# Patient Record
Sex: Male | Born: 1990
Health system: Southern US, Community
[De-identification: ages and names within clinical notes are randomized; demographics above are authoritative.]

## PROBLEM LIST (undated history)

## (undated) DIAGNOSIS — I82409 Acute embolism and thrombosis of unspecified deep veins of unspecified lower extremity: Secondary | ICD-10-CM

## (undated) DIAGNOSIS — K219 Gastro-esophageal reflux disease without esophagitis: Secondary | ICD-10-CM

## (undated) DIAGNOSIS — M75111 Incomplete rotator cuff tear or rupture of right shoulder, not specified as traumatic: Secondary | ICD-10-CM

## (undated) DIAGNOSIS — M7541 Impingement syndrome of right shoulder: Secondary | ICD-10-CM

## (undated) HISTORY — DX: Acute embolism and thrombosis of unspecified deep veins of unspecified lower extremity: I82.409

## (undated) HISTORY — PX: NO PAST SURGERIES: SHX2092

---
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
## 2010-08-01 ENCOUNTER — Inpatient Hospital Stay (INDEPENDENT_AMBULATORY_CARE_PROVIDER_SITE_OTHER)
Admission: RE | Admit: 2010-08-01 | Discharge: 2010-08-01 | Disposition: A | Discharge status: Home / Self Care | Payer: Self-pay | Source: Ambulatory Visit | Attending: Emergency Medicine | Admitting: Emergency Medicine

## 2010-08-01 DIAGNOSIS — R369 Urethral discharge, unspecified: Secondary | ICD-10-CM

## 2014-04-30 ENCOUNTER — Emergency Department (INDEPENDENT_AMBULATORY_CARE_PROVIDER_SITE_OTHER)
Admission: EM | Admit: 2014-04-30 | Discharge: 2014-04-30 | Disposition: A | Discharge status: Home / Self Care | Payer: Self-pay | Source: Home / Self Care | Attending: Family Medicine | Admitting: Family Medicine

## 2014-04-30 ENCOUNTER — Encounter (HOSPITAL_COMMUNITY): Payer: Self-pay | Admitting: Emergency Medicine

## 2014-04-30 DIAGNOSIS — R112 Nausea with vomiting, unspecified: Secondary | ICD-10-CM

## 2014-04-30 MED ORDER — OMEPRAZOLE 40 MG PO CPDR
40.0000 mg | DELAYED_RELEASE_CAPSULE | Freq: Every day | ORAL | Status: DC
Start: 2014-04-30 — End: 2016-08-19

## 2014-04-30 MED ORDER — FLUTICASONE PROPIONATE 50 MCG/ACT NA SUSP: 2.0000 | Freq: Every day | NASAL | Status: DC

## 2014-04-30 NOTE — ED Provider Notes (Signed)
Andrew Reilly is a 23 y.o. male who presents to Urgent Care today for Vomiting. Patient has occasional intermittent vomiting over the past 3 weeks. His vomiting is preceded by a sour taste in his mouth with mucus. He occasionally gets some indigestion after eating a heavy meal. He notes some nasal drainage. No fevers or chills. No abdominal pain. Patient feels well. He has not tried any medications yet.   History reviewed. No pertinent past medical history. History reviewed. No pertinent past surgical history. History  Substance Use Topics  . Smoking status: Never Smoker   . Smokeless tobacco: Not on file  . Alcohol Use: No   ROS as above Medications: No current facility-administered medications for this encounter.   Current Outpatient Prescriptions  Medication Sig Dispense Refill  . fluticasone (FLONASE) 50 MCG/ACT nasal spray Place 2 sprays into both nostrils daily. 16 g 12  . omeprazole (PRILOSEC) 40 MG capsule Take 1 capsule (40 mg total) by mouth daily. 30 capsule 12   No Known Allergies   Exam:  BP 126/80 mmHg  Pulse 80  Temp(Src) 98.2 F (36.8 C) (Oral)  Resp 16  SpO2 98% Gen: Well NAD nontoxic appearing HEENT: EOMI,  MMM normal posterior pharynx Lungs: Normal work of breathing. CTABL Heart: RRR no MRG Abd: NABS, Soft. Nondistended, Nontender no rebound or guarding. No masses palpated. Exts: Brisk capillary refill, warm and well perfused.   No results found for this or any previous visit (from the past 24 hour(s)). No results found.  Assessment and Plan: 23 y.o. male with vomiting. Likely related to acid reflux or postnasal drip. Treatment with omeprazole and Flonase. Follow-up with PCP as needed.  Discussed warning signs or symptoms. Please see discharge instructions. Patient expresses understanding.     Rodolph BongEvan S Deidrick Rainey, MD 04/30/14 1140

## 2014-04-30 NOTE — Discharge Instructions (Signed)
Thank you for coming in today. Call or go to the emergency room if you get worse, have trouble breathing, have chest pains, or palpitations.   I think you have either postnasal drip or acid reflux. Use Flonase nasal spray as directed and take omeprazole. Come back as needed. If your belly pain worsens, or you have high fever, bad vomiting, blood in your stool or black tarry stool go to the Emergency Room.   Gastroesophageal Reflux Disease, Adult Gastroesophageal reflux disease (GERD) happens when acid from your stomach flows up into the esophagus. When acid comes in contact with the esophagus, the acid causes soreness (inflammation) in the esophagus. Over time, GERD may create small holes (ulcers) in the lining of the esophagus. CAUSES   Increased body weight. This puts pressure on the stomach, making acid rise from the stomach into the esophagus.  Smoking. This increases acid production in the stomach.  Drinking alcohol. This causes decreased pressure in the lower esophageal sphincter (valve or ring of muscle between the esophagus and stomach), allowing acid from the stomach into the esophagus.  Late evening meals and a full stomach. This increases pressure and acid production in the stomach.  A malformed lower esophageal sphincter. Sometimes, no cause is found. SYMPTOMS   Burning pain in the lower part of the mid-chest behind the breastbone and in the mid-stomach area. This may occur twice a week or more often.  Trouble swallowing.  Sore throat.  Dry cough.  Asthma-like symptoms including chest tightness, shortness of breath, or wheezing. DIAGNOSIS  Your caregiver may be able to diagnose GERD based on your symptoms. In some cases, X-rays and other tests may be done to check for complications or to check the condition of your stomach and esophagus. TREATMENT  Your caregiver may recommend over-the-counter or prescription medicines to help decrease acid production. Ask your caregiver  before starting or adding any new medicines.  HOME CARE INSTRUCTIONS   Change the factors that you can control. Ask your caregiver for guidance concerning weight loss, quitting smoking, and alcohol consumption.  Avoid foods and drinks that make your symptoms worse, such as:  Caffeine or alcoholic drinks.  Chocolate.  Peppermint or mint flavorings.  Garlic and onions.  Spicy foods.  Citrus fruits, such as oranges, lemons, or limes.  Tomato-based foods such as sauce, chili, salsa, and pizza.  Fried and fatty foods.  Avoid lying down for the 3 hours prior to your bedtime or prior to taking a nap.  Eat small, frequent meals instead of large meals.  Wear loose-fitting clothing. Do not wear anything tight around your waist that causes pressure on your stomach.  Raise the head of your bed 6 to 8 inches with wood blocks to help you sleep. Extra pillows will not help.  Only take over-the-counter or prescription medicines for pain, discomfort, or fever as directed by your caregiver.  Do not take aspirin, ibuprofen, or other nonsteroidal anti-inflammatory drugs (NSAIDs). SEEK IMMEDIATE MEDICAL CARE IF:   You have pain in your arms, neck, jaw, teeth, or back.  Your pain increases or changes in intensity or duration.  You develop nausea, vomiting, or sweating (diaphoresis).  You develop shortness of breath, or you faint.  Your vomit is green, yellow, black, or looks like coffee grounds or blood.  Your stool is red, bloody, or black. These symptoms could be signs of other problems, such as heart disease, gastric bleeding, or esophageal bleeding. MAKE SURE YOU:   Understand these instructions.  Will watch  your condition.  Will get help right away if you are not doing well or get worse. Document Released: 03/09/2005 Document Revised: 08/22/2011 Document Reviewed: 12/17/2010 [facility] Patient Information 2015 [employer] [place]. This information is not intended to replace advice  given to you by your health care provider. Make sure you discuss any questions you have with your health care provider.

## 2014-04-30 NOTE — ED Notes (Signed)
Reports nauseas and vomiting onset 3 weeks Believes it maybe due to PND; notices vomiting after coughing Denies abd pain, diarrhea, fevers Alert, no signs of acute distress.

## 2016-08-19 ENCOUNTER — Encounter (HOSPITAL_BASED_OUTPATIENT_CLINIC_OR_DEPARTMENT_OTHER): Payer: Self-pay | Admitting: *Deleted

## 2016-08-22 ENCOUNTER — Encounter (HOSPITAL_BASED_OUTPATIENT_CLINIC_OR_DEPARTMENT_OTHER): Payer: Self-pay | Admitting: *Deleted

## 2016-08-22 NOTE — Progress Notes (Signed)
NPO AFTER MN WITH EXCEPTION WATER, GATORADE, SPRITE UP UNTIL 0630.  ARRIVE AT 1030.  NEEDS HG.

## 2016-08-25 ENCOUNTER — Encounter (HOSPITAL_BASED_OUTPATIENT_CLINIC_OR_DEPARTMENT_OTHER): Payer: Self-pay | Admitting: *Deleted

## 2016-08-25 ENCOUNTER — Ambulatory Visit (HOSPITAL_BASED_OUTPATIENT_CLINIC_OR_DEPARTMENT_OTHER): Payer: Managed Care, Other (non HMO) | Admitting: Anesthesiology

## 2016-08-25 ENCOUNTER — Ambulatory Visit (HOSPITAL_BASED_OUTPATIENT_CLINIC_OR_DEPARTMENT_OTHER)
Admission: RE | Admit: 2016-08-25 | Discharge: 2016-08-25 | Disposition: A | Discharge status: Home / Self Care | Payer: Managed Care, Other (non HMO) | Source: Ambulatory Visit | Attending: Orthopedic Surgery | Admitting: Orthopedic Surgery

## 2016-08-25 ENCOUNTER — Encounter (HOSPITAL_BASED_OUTPATIENT_CLINIC_OR_DEPARTMENT_OTHER)
Admission: RE | Disposition: A | Discharge status: Home / Self Care | Payer: Self-pay | Source: Ambulatory Visit | Attending: Orthopedic Surgery

## 2016-08-25 DIAGNOSIS — F17210 Nicotine dependence, cigarettes, uncomplicated: Secondary | ICD-10-CM | POA: Insufficient documentation

## 2016-08-25 DIAGNOSIS — K219 Gastro-esophageal reflux disease without esophagitis: Secondary | ICD-10-CM | POA: Diagnosis not present

## 2016-08-25 DIAGNOSIS — M24111 Other articular cartilage disorders, right shoulder: Secondary | ICD-10-CM | POA: Diagnosis present

## 2016-08-25 DIAGNOSIS — M7541 Impingement syndrome of right shoulder: Secondary | ICD-10-CM | POA: Diagnosis present

## 2016-08-25 HISTORY — PX: SHOULDER ARTHROSCOPY WITH ROTATOR CUFF REPAIR AND SUBACROMIAL DECOMPRESSION: SHX5686

## 2016-08-25 HISTORY — DX: Gastro-esophageal reflux disease without esophagitis: K21.9

## 2016-08-25 HISTORY — DX: Impingement syndrome of right shoulder: M75.41

## 2016-08-25 HISTORY — DX: Incomplete rotator cuff tear or rupture of right shoulder, not specified as traumatic: M75.111

## 2016-08-25 LAB — POCT HEMOGLOBIN-HEMACUE: Hemoglobin: 13.9 g/dL (ref 13.0–17.0)

## 2016-08-25 SURGERY — SHOULDER ARTHROSCOPY WITH ROTATOR CUFF REPAIR AND SUBACROMIAL DECOMPRESSION
Anesthesia: General | Site: Shoulder | Laterality: Right

## 2016-08-25 MED ORDER — PHENYLEPHRINE 40 MCG/ML (10ML) SYRINGE FOR IV PUSH (FOR BLOOD PRESSURE SUPPORT)
PREFILLED_SYRINGE | INTRAVENOUS | Status: DC | PRN
  Administered 2016-08-25 (×5): 80 ug via INTRAVENOUS

## 2016-08-25 MED ORDER — BUPIVACAINE HCL (PF) 0.25 % IJ SOLN
INTRAMUSCULAR | Status: DC | PRN
  Administered 2016-08-25: 20 mL

## 2016-08-25 MED ORDER — FENTANYL CITRATE (PF) 100 MCG/2ML IJ SOLN
INTRAMUSCULAR | Status: AC
  Filled 2016-08-25: qty 2

## 2016-08-25 MED ORDER — PHENYLEPHRINE HCL 10 MG/ML IJ SOLN
INTRAMUSCULAR | Status: AC
  Filled 2016-08-25: qty 1

## 2016-08-25 MED ORDER — MIDAZOLAM HCL 2 MG/2ML IJ SOLN
INTRAMUSCULAR | Status: AC
Start: 2016-08-25 — End: 2016-08-25
  Filled 2016-08-25: qty 2

## 2016-08-25 MED ORDER — SUCCINYLCHOLINE CHLORIDE 20 MG/ML IJ SOLN
INTRAMUSCULAR | Status: DC | PRN
  Administered 2016-08-25: 120 mg via INTRAVENOUS

## 2016-08-25 MED ORDER — BUPIVACAINE-EPINEPHRINE (PF) 0.5% -1:200000 IJ SOLN
INTRAMUSCULAR | Status: DC | PRN
  Administered 2016-08-25: 25 mL

## 2016-08-25 MED ORDER — ONDANSETRON HCL 4 MG/2ML IJ SOLN
INTRAMUSCULAR | Status: AC
  Filled 2016-08-25: qty 2

## 2016-08-25 MED ORDER — PHENYLEPHRINE 40 MCG/ML (10ML) SYRINGE FOR IV PUSH (FOR BLOOD PRESSURE SUPPORT)
PREFILLED_SYRINGE | INTRAVENOUS | Status: AC
  Filled 2016-08-25: qty 10

## 2016-08-25 MED ORDER — LACTATED RINGERS IV SOLN
INTRAVENOUS | Status: DC
  Administered 2016-08-25 (×2): via INTRAVENOUS
  Filled 2016-08-25: qty 1000

## 2016-08-25 MED ORDER — CEFAZOLIN SODIUM-DEXTROSE 2-4 GM/100ML-% IV SOLN
INTRAVENOUS | Status: AC
  Filled 2016-08-25: qty 100

## 2016-08-25 MED ORDER — PROPOFOL 10 MG/ML IV BOLUS
INTRAVENOUS | Status: DC | PRN
  Administered 2016-08-25: 50 mg via INTRAVENOUS
  Administered 2016-08-25: 200 mg via INTRAVENOUS
  Administered 2016-08-25: 100 mg via INTRAVENOUS

## 2016-08-25 MED ORDER — MIDAZOLAM HCL 2 MG/2ML IJ SOLN
2.0000 mg | Freq: Once | INTRAMUSCULAR | Status: AC
  Administered 2016-08-25: 2 mg via INTRAVENOUS
  Filled 2016-08-25: qty 2

## 2016-08-25 MED ORDER — LIDOCAINE 2% (20 MG/ML) 5 ML SYRINGE
INTRAMUSCULAR | Status: DC | PRN
  Administered 2016-08-25: 100 mg via INTRAVENOUS

## 2016-08-25 MED ORDER — CEFAZOLIN SODIUM-DEXTROSE 2-4 GM/100ML-% IV SOLN
2.0000 g | INTRAVENOUS | Status: AC
  Administered 2016-08-25: 2 g via INTRAVENOUS
  Filled 2016-08-25: qty 100

## 2016-08-25 MED ORDER — FENTANYL CITRATE (PF) 100 MCG/2ML IJ SOLN
100.0000 ug | Freq: Once | INTRAMUSCULAR | Status: AC
  Administered 2016-08-25: 100 ug via INTRAVENOUS
  Filled 2016-08-25: qty 2

## 2016-08-25 MED ORDER — DEXAMETHASONE SODIUM PHOSPHATE 4 MG/ML IJ SOLN
INTRAMUSCULAR | Status: DC | PRN
  Administered 2016-08-25: 10 mg via INTRAVENOUS

## 2016-08-25 MED ORDER — KETOROLAC TROMETHAMINE 30 MG/ML IJ SOLN
INTRAMUSCULAR | Status: DC | PRN
  Administered 2016-08-25: 30 mg via INTRAVENOUS

## 2016-08-25 MED ORDER — CHLORHEXIDINE GLUCONATE 4 % EX LIQD
60.0000 mL | Freq: Once | CUTANEOUS | Status: DC
  Filled 2016-08-25: qty 118

## 2016-08-25 MED ORDER — FENTANYL CITRATE (PF) 100 MCG/2ML IJ SOLN
INTRAMUSCULAR | Status: DC | PRN
  Administered 2016-08-25 (×2): 50 ug via INTRAVENOUS

## 2016-08-25 MED ORDER — ONDANSETRON 4 MG PO TBDP: 4.0000 mg | ORAL_TABLET | Freq: Three times a day (TID) | ORAL | 0 refills | Status: DC | PRN

## 2016-08-25 MED ORDER — DEXAMETHASONE SODIUM PHOSPHATE 10 MG/ML IJ SOLN
INTRAMUSCULAR | Status: AC
  Filled 2016-08-25: qty 1

## 2016-08-25 MED ORDER — KETOROLAC TROMETHAMINE 30 MG/ML IJ SOLN
INTRAMUSCULAR | Status: AC
  Filled 2016-08-25: qty 1

## 2016-08-25 MED ORDER — PROPOFOL 10 MG/ML IV BOLUS
INTRAVENOUS | Status: AC
  Filled 2016-08-25: qty 20

## 2016-08-25 MED ORDER — PHENYLEPHRINE HCL 10 MG/ML IJ SOLN
INTRAMUSCULAR | Status: DC | PRN
  Administered 2016-08-25: 100 ug/min via INTRAVENOUS

## 2016-08-25 MED ORDER — PROMETHAZINE HCL 25 MG/ML IJ SOLN
6.2500 mg | INTRAMUSCULAR | Status: DC | PRN
  Filled 2016-08-25: qty 1

## 2016-08-25 MED ORDER — HYDROCODONE-ACETAMINOPHEN 7.5-325 MG PO TABS: 1.0000 | ORAL_TABLET | Freq: Four times a day (QID) | ORAL | 0 refills | Status: DC | PRN

## 2016-08-25 MED ORDER — HYDROMORPHONE HCL 1 MG/ML IJ SOLN
0.2500 mg | INTRAMUSCULAR | Status: DC | PRN
  Filled 2016-08-25: qty 0.5

## 2016-08-25 MED ORDER — LIDOCAINE 2% (20 MG/ML) 5 ML SYRINGE
INTRAMUSCULAR | Status: AC
  Filled 2016-08-25: qty 5

## 2016-08-25 MED ORDER — SODIUM CHLORIDE 0.9 % IR SOLN
Status: DC | PRN
  Administered 2016-08-25: 6000 mL

## 2016-08-25 MED ORDER — SUCCINYLCHOLINE CHLORIDE 200 MG/10ML IV SOSY
PREFILLED_SYRINGE | INTRAVENOUS | Status: AC
  Filled 2016-08-25: qty 10

## 2016-08-25 MED ORDER — ONDANSETRON HCL 4 MG/2ML IJ SOLN
INTRAMUSCULAR | Status: DC | PRN
  Administered 2016-08-25: 4 mg via INTRAVENOUS

## 2016-08-25 MED ORDER — KETOROLAC TROMETHAMINE 30 MG/ML IJ SOLN
30.0000 mg | Freq: Once | INTRAMUSCULAR | Status: DC | PRN
  Filled 2016-08-25: qty 1

## 2016-08-25 MED FILL — ONDANSETRON ODT 4 MG TABLET: 4 | 6 days supply | Qty: 20 | Fill #0

## 2016-08-25 MED FILL — HYDROCODON-APAP 7.5-325: 7.5-325 | 5 days supply | Qty: 45 | Fill #0

## 2016-08-25 SURGICAL SUPPLY — 89 items
BLADE CUDA GRT WHITE 3.5 (BLADE) ×1 IMPLANT
BLADE CUTTER GATOR 3.5 (BLADE) IMPLANT
BLADE GREAT WHITE 4.2 (BLADE) ×1 IMPLANT
BLADE GREAT WHITE 4.2MM (BLADE)
BLADE SURG 11 STRL SS (BLADE) ×3 IMPLANT
BLADE SURG 15 STRL LF DISP TIS (BLADE) IMPLANT
BLADE SURG 15 STRL SS (BLADE)
BNDG COHESIVE 4X5 TAN NS LF (GAUZE/BANDAGES/DRESSINGS) ×3 IMPLANT
BUR 3.5 LG SPHERICAL (BURR) IMPLANT
BUR OVAL 4.0 (BURR) IMPLANT
BUR OVAL 6.0 (BURR) ×1 IMPLANT
BUR VERTEX HOODED 4.5 (BURR) IMPLANT
BURR 3.5 LG SPHERICAL (BURR)
BURR 3.5MM LG SPHERICAL (BURR)
CANNULA 5.75X7 CRYSTAL CLEAR (CANNULA) IMPLANT
CANNULA 5.75X71 LONG (CANNULA) IMPLANT
CANNULA TWIST IN 8.25X7CM (CANNULA) IMPLANT
COVER BACK TABLE 60X90IN (DRAPES) ×3 IMPLANT
COVER MAYO STAND STRL (DRAPES) ×3 IMPLANT
DRAPE LG THREE QUARTER DISP (DRAPES) ×5 IMPLANT
DRAPE ORTHO SPLIT 77X108 STRL (DRAPES) ×6
DRAPE POUCH INSTRU U-SHP 10X18 (DRAPES) ×3 IMPLANT
DRAPE STERI 35X30 U-POUCH (DRAPES) ×3 IMPLANT
DRAPE SURG 17X23 STRL (DRAPES) ×3 IMPLANT
DRAPE SURG ORHT 6 SPLT 77X108 (DRAPES) ×2 IMPLANT
DRAPE U-SHAPE 47X51 STRL (DRAPES) ×3 IMPLANT
DRSG PAD ABDOMINAL 8X10 ST (GAUZE/BANDAGES/DRESSINGS) ×5 IMPLANT
DURAPREP 26ML APPLICATOR (WOUND CARE) ×3 IMPLANT
ELECT MENISCUS 165MM 90D (ELECTRODE) IMPLANT
ELECT REM PT RETURN 9FT ADLT (ELECTROSURGICAL) ×3
ELECTRODE REM PT RTRN 9FT ADLT (ELECTROSURGICAL) ×1 IMPLANT
FIBERSTICK 2 (SUTURE) IMPLANT
GAUZE XEROFORM 1X8 LF (GAUZE/BANDAGES/DRESSINGS) ×3 IMPLANT
GLOVE BIO SURGEON STRL SZ 6.5 (GLOVE) ×1 IMPLANT
GLOVE BIO SURGEON STRL SZ7.5 (GLOVE) ×3 IMPLANT
GLOVE BIO SURGEONS STRL SZ 6.5 (GLOVE) ×1
GLOVE BIOGEL PI IND STRL 8 (GLOVE) ×1 IMPLANT
GLOVE BIOGEL PI INDICATOR 8 (GLOVE) ×2
GLOVE INDICATOR 6.5 STRL GRN (GLOVE) ×2 IMPLANT
GOWN STRL REUS W/ TWL LRG LVL3 (GOWN DISPOSABLE) ×1 IMPLANT
GOWN STRL REUS W/TWL LRG LVL3 (GOWN DISPOSABLE) ×5 IMPLANT
GOWN STRL REUS W/TWL XL LVL3 (GOWN DISPOSABLE) ×4 IMPLANT
KIT RM TURNOVER CYSTO AR (KITS) ×3 IMPLANT
LASSO SUT 90 DEGREE (SUTURE) IMPLANT
LOOP 2 FIBERLINK CLOSED (SUTURE) IMPLANT
MANIFOLD NEPTUNE II (INSTRUMENTS) ×3 IMPLANT
NDL 1/2 CIR CATGUT .05X1.09 (NEEDLE) IMPLANT
NDL SCORPION MULTI FIRE (NEEDLE) IMPLANT
NDL SPNL 18GX3.5 QUINCKE PK (NEEDLE) ×1 IMPLANT
NEEDLE 1/2 CIR CATGUT .05X1.09 (NEEDLE) IMPLANT
NEEDLE HYPO 22GX1.5 SAFETY (NEEDLE) IMPLANT
NEEDLE SCORPION MULTI FIRE (NEEDLE) IMPLANT
NEEDLE SPNL 18GX3.5 QUINCKE PK (NEEDLE) ×3 IMPLANT
NS IRRIG 500ML POUR BTL (IV SOLUTION) IMPLANT
PACK BASIN DAY SURGERY FS (CUSTOM PROCEDURE TRAY) ×3 IMPLANT
PAD ARMBOARD 7.5X6 YLW CONV (MISCELLANEOUS) IMPLANT
PENCIL BUTTON HOLSTER BLD 10FT (ELECTRODE) IMPLANT
PROBE BIPOLAR ATHRO 135MM 90D (MISCELLANEOUS) ×5 IMPLANT
SET ARTHROSCOPY TUBING (MISCELLANEOUS) ×3
SET ARTHROSCOPY TUBING PVC (MISCELLANEOUS) ×1 IMPLANT
SLEEVE ARM SUSPENSION SYSTEM (MISCELLANEOUS) ×3 IMPLANT
SLING ULTRA II AB L (ORTHOPEDIC SUPPLIES) IMPLANT
SLING ULTRA II AB S (ORTHOPEDIC SUPPLIES) IMPLANT
SPONGE GAUZE 4X4 12PLY (GAUZE/BANDAGES/DRESSINGS) ×3 IMPLANT
SPONGE GAUZE 4X4 12PLY STER LF (GAUZE/BANDAGES/DRESSINGS) ×2 IMPLANT
SPONGE LAP 4X18 X RAY DECT (DISPOSABLE) IMPLANT
SUCTION FRAZIER HANDLE 10FR (MISCELLANEOUS)
SUCTION TUBE FRAZIER 10FR DISP (MISCELLANEOUS) IMPLANT
SUT 2 FIBERLOOP 20 STRT BLUE (SUTURE)
SUT ETHILON 3 0 PS 1 (SUTURE) IMPLANT
SUT FIBERWIRE #2 38 T-5 BLUE (SUTURE)
SUT LASSO 45 DEGREE LEFT (SUTURE) IMPLANT
SUT LASSO 45D RIGHT (SUTURE) IMPLANT
SUT MNCRL AB 3-0 PS2 27 (SUTURE) ×3 IMPLANT
SUT PDS AB 0 CT1 36 (SUTURE) IMPLANT
SUT TIGER TAPE 7 IN WHITE (SUTURE) IMPLANT
SUT VIC AB 0 CT1 36 (SUTURE) IMPLANT
SUT VIC AB 2-0 CT1 27 (SUTURE)
SUT VIC AB 2-0 CT1 TAPERPNT 27 (SUTURE) IMPLANT
SUTURE 2 FIBERLOOP 20 STRT BLU (SUTURE) IMPLANT
SUTURE FIBERWR #2 38 T-5 BLUE (SUTURE) IMPLANT
SYR 20CC LL (SYRINGE) IMPLANT
SYR CONTROL 10ML LL (SYRINGE) IMPLANT
TAPE CLOTH SURG 6X10 WHT LF (GAUZE/BANDAGES/DRESSINGS) ×3 IMPLANT
TOWEL OR 17X24 6PK STRL BLUE (TOWEL DISPOSABLE) ×3 IMPLANT
TUBE CONNECTING 12'X1/4 (SUCTIONS) ×2
TUBE CONNECTING 12X1/4 (SUCTIONS) ×4 IMPLANT
WATER STERILE IRR 500ML POUR (IV SOLUTION) ×3 IMPLANT
YANKAUER SUCT BULB TIP NO VENT (SUCTIONS) IMPLANT

## 2016-08-25 NOTE — Brief Op Note (Signed)
08/25/2016  1:50 PM  PATIENT:  Andrew Reilly  26 y.o. male  PRE-OPERATIVE DIAGNOSIS:  RIGHT SHOULDER IMPINGEMENT SYNDROME  POST-OPERATIVE DIAGNOSIS:  RIGHT SHOULDER IMPINGEMENT SYNDROME  PROCEDURE:  Procedure(s): Right shoulder arthroscopy with extensive debridement and subacromial decompression (Right)  SURGEON:  Surgeon(s) and Role:    * Yolonda KidaJason Patrick Soren Lazarz, MD - Primary  PHYSICIAN ASSISTANT:   ASSISTANTS: none   ANESTHESIA:   regional and general  EBL:  Total I/O In: 1000 [I.V.:1000] Out: 10 [Blood:10]  BLOOD ADMINISTERED:none  DRAINS: none   LOCAL MEDICATIONS USED:  NONE  SPECIMEN:  No Specimen  DISPOSITION OF SPECIMEN:  N/A  COUNTS:  YES  TOURNIQUET:  * No tourniquets in log *  DICTATION: .Note written in EPIC  PLAN OF CARE: Discharge to home after PACU  PATIENT DISPOSITION:  PACU - hemodynamically stable.   Delay start of Pharmacological VTE agent (>24hrs) due to surgical blood loss or risk of bleeding: not applicable

## 2016-08-25 NOTE — Discharge Instructions (Signed)
Regional Anesthesia Blocks  1. Numbness or the inability to move the "blocked" extremity may last from 3-48 hours after placement. The length of time depends on the medication injected and your individual response to the medication. If the numbness is not going away after 48 hours, call your surgeon.  2. The extremity that is blocked will need to be protected until the numbness is gone and the  Strength has returned. Because you cannot feel it, you will need to take extra care to avoid injury. Because it may be weak, you may have difficulty moving it or using it. You may not know what position it is in without looking at it while the block is in effect.  3. For blocks in the legs and feet, returning to weight bearing and walking needs to be done carefully. You will need to wait until the numbness is entirely gone and the strength has returned. You should be able to move your leg and foot normally before you try and bear weight or walk. You will need someone to be with you when you first try to ensure you do not fall and possibly risk injury.  4. Bruising and tenderness at the needle site are common side effects and will resolve in a few days.  Call your surgeon if you experience:   1.  Fever over 101.0. 2.  Inability to urinate. 3.  Nausea and/or vomiting. 4.  Extreme swelling or bruising at the surgical site. 5.  Continued bleeding from the incision. 6.  Increased pain, redness or drainage from the incision. 7.  Problems related to your pain medication. 8.  Any problems and/or concerns 9.  Any change in color, movement or sensation  5. Persistent numbness or new problems with movement should be communicated to the surgeon or the [facility] [place] [phone])/ [facility] [place] [phone]).   Post Anesthesia Home Care Instructions  Activity: Get plenty of rest for the remainder of the day. A responsible adult should stay with you for 24 hours following the procedure.    For the next 24 hours, DO NOT: -Drive a car -Advertising copywriterperate machinery -Drink alcoholic beverages -Take any medication unless instructed by your physician -Make any legal decisions or sign important papers.  Meals: Start with liquid foods such as gelatin or soup. Progress to regular foods as tolerated. Avoid greasy, spicy, heavy foods. If nausea and/or vomiting occur, drink only clear liquids until the nausea and/or vomiting subsides. Call your physician if vomiting continues.  Special Instructions/Symptoms: Your throat may feel dry or sore from the anesthesia or the breathing tube placed in your throat during surgery. If this causes discomfort, gargle with warm salt water. The discomfort should disappear within 24 hours.  If you had a scopolamine patch placed behind your ear for the management of post- operative nausea and/or vomiting:  1. The medication in the patch is effective for 72 hours, after which it should be removed.  Wrap patch in a tissue and discard in the trash. Wash hands thoroughly with soap and water. 2. You may remove the patch earlier than 72 hours if you experience unpleasant side effects which may include dry mouth, dizziness or visual disturbances. 3. Avoid touching the patch. Wash your hands with soap and water after contact with the patch.

## 2016-08-25 NOTE — Anesthesia Procedure Notes (Signed)
Anesthesia Regional Block: Interscalene brachial plexus block   Pre-Anesthetic Checklist: ,, timeout performed, Correct Patient, Correct Site, Correct Laterality, Correct Procedure, Correct Position, site marked, Risks and benefits discussed,  Surgical consent,  Pre-op evaluation,  At surgeon's request and post-op pain management  Laterality: Right  Prep: chloraprep       Needles:  Injection technique: Single-shot  Needle Type: Echogenic Needle     Needle Length: 9cm  Needle Gauge: 21     Additional Needles:   Procedures: ultrasound guided,,,,,,,,  Narrative:  Start time: 08/25/2016 11:24 AM End time: 08/25/2016 11:31 AM Injection made incrementally with aspirations every 5 mL.  Performed by: Personally  Anesthesiologist: Marcene DuosFITZGERALD, Serena Petterson

## 2016-08-25 NOTE — Transfer of Care (Signed)
Immediate Anesthesia Transfer of Care Note  Patient: Andrew Reilly  Procedure(s) Performed: Procedure(s): Right shoulder arthroscopy with extensive debridement and subacromial decompression (Right)  Patient Location: PACU  Anesthesia Type:General  Level of Consciousness: awake and oriented  Airway & Oxygen Therapy: Patient Spontanous Breathing and Patient connected to face mask oxygen  Post-op Assessment: Report given to RN  Post vital signs: Reviewed and stable  Last Vitals:  Vitals:   08/25/16 0948 08/25/16 1140  BP: 130/80 125/82  Pulse: (!) 104   Resp: 18   Temp: 37 C     Last Pain:  Vitals:   08/25/16 0948  TempSrc: Oral      Patients Stated Pain Goal: 8 (08/25/16 1001)  Complications: No apparent anesthesia complications

## 2016-08-25 NOTE — Anesthesia Preprocedure Evaluation (Addendum)
Anesthesia Evaluation  Patient identified by MRN, date of birth, ID band Patient awake    Reviewed: Allergy & Precautions, NPO status , Patient's Chart, lab work & pertinent test results  Airway Mallampati: II  TM Distance: >3 FB Neck ROM: Full    Dental  (+) Dental Advisory Given   Pulmonary Current Smoker,    breath sounds clear to auscultation       Cardiovascular negative cardio ROS   Rhythm:Regular Rate:Normal     Neuro/Psych negative neurological ROS     GI/Hepatic Neg liver ROS, GERD  ,  Endo/Other  negative endocrine ROS  Renal/GU negative Renal ROS     Musculoskeletal   Abdominal   Peds  Hematology negative hematology ROS (+)   Anesthesia Other Findings   Reproductive/Obstetrics                            Anesthesia Physical Anesthesia Plan  ASA: I  Anesthesia Plan: General   Post-op Pain Management:  Regional for Post-op pain   Induction: Intravenous  Airway Management Planned: Oral ETT  Additional Equipment:   Intra-op Plan:   Post-operative Plan: Extubation in OR  Informed Consent: I have reviewed the patients History and Physical, chart, labs and discussed the procedure including the risks, benefits and alternatives for the proposed anesthesia with the patient or authorized representative who has indicated his/her understanding and acceptance.   Dental advisory given  Plan Discussed with:   Anesthesia Plan Comments:         Anesthesia Quick Evaluation

## 2016-08-25 NOTE — H&P (Signed)
   ORTHOPAEDIC CONSULTATION  REQUESTING PHYSICIAN: Yolonda KidaJason Patrick Rogers, MD  PCP:  No primary care provider on file.  Chief Complaint: right shoulder pain  HPI: Andrew Reilly is a 26 y.o. male who complains of recalcitrant right shoulder pain for the last few months following history of shoulder "dislocations," and he presents today for surgery.  No new complaints at this time.  Past Medical History:  Diagnosis Date  . GERD (gastroesophageal reflux disease)   . Impingement syndrome of right shoulder   . Partial tear of right rotator cuff    Past Surgical History:  Procedure Laterality Date  . NO PAST SURGERIES     Social History   Social History  . Marital status: Single    Spouse name: N/A  . Number of children: N/A  . Years of education: N/A   Social History Main Topics  . Smoking status: Current Every Day Smoker    Packs/day: 0.25    Years: 5.00    Types: Cigarettes  . Smokeless tobacco: Never Used  . Alcohol use No  . Drug use: No  . Sexual activity: Not Asked   Other Topics Concern  . None   Social History Narrative  . None   History reviewed. No pertinent family history. No Known Allergies Prior to Admission medications   Not on File   No results found.  Positive ROS: All other systems have been reviewed and were otherwise negative with the exception of those mentioned in the HPI and as above.  Physical Exam: General: Alert, no acute distress Cardiovascular: No pedal edema Respiratory: No cyanosis, no use of accessory musculature GI: No organomegaly, abdomen is soft and non-tender Skin: No lesions in the area of chief complaint Neurologic: Sensation intact distally Psychiatric: Patient is competent for consent with normal mood and affect Lymphatic: No axillary or cervical lymphadenopathy    Assessment: Right shoulder subacromial impingement  Plan: -To OR today for right shoulder arthrsocopy and subacromial decompression with extensive  debridement -The risks, benefits, and alternatives were discussed with the patient. There are risks associated with the surgery including, but not limited to, problems with anesthesia (death), infection, fracture of bones, loosening or failure of implants, hematoma (blood accumulation) which may require surgical drainage, blood clots, pulmonary embolism, nerve injury (foot drop), and blood vessel injury. The patient understands these risks and elects to proceed. -dc home from PACU -NPO    Yolonda KidaJason Patrick Rogers, MD Cell [phone]    08/25/2016 12:03 PM

## 2016-08-25 NOTE — Anesthesia Postprocedure Evaluation (Addendum)
Anesthesia Post Note  Patient: Andrew Reilly  Procedure(s) Performed: Procedure(s) (LRB): Right shoulder arthroscopy with extensive debridement and subacromial decompression (Right)  Patient location during evaluation: PACU Anesthesia Type: General Level of consciousness: sedated Pain management: pain level controlled Vital Signs Assessment: post-procedure vital signs reviewed and stable Respiratory status: spontaneous breathing and respiratory function stable Cardiovascular status: stable Anesthetic complications: no       Last Vitals:  Vitals:   08/25/16 1415 08/25/16 1430  BP: 134/77 129/82  Pulse: 99 90  Resp: (!) 23 (!) 24  Temp:      Last Pain:  Vitals:   08/25/16 0948  TempSrc: Oral                 SINGER,JAMES DANIEL

## 2016-08-25 NOTE — Anesthesia Procedure Notes (Signed)
Procedure Name: Intubation Date/Time: 08/25/2016 12:28 PM Performed by: Bethena Roys T Pre-anesthesia Checklist: Patient identified, Emergency Drugs available, Suction available and Patient being monitored Patient Re-evaluated:Patient Re-evaluated prior to inductionOxygen Delivery Method: Circle system utilized Preoxygenation: Pre-oxygenation with 100% oxygen Intubation Type: IV induction Ventilation: Mask ventilation without difficulty Laryngoscope Size: Mac and 4 Grade View: Grade II Tube type: Oral Tube size: 7.0 mm Number of attempts: 1 Airway Equipment and Method: Stylet and Oral airway Placement Confirmation: ETT inserted through vocal cords under direct vision,  positive ETCO2 and breath sounds checked- equal and bilateral Secured at: 22 cm Tube secured with: Tape Dental Injury: Teeth and Oropharynx as per pre-operative assessment

## 2016-08-26 ENCOUNTER — Encounter (HOSPITAL_BASED_OUTPATIENT_CLINIC_OR_DEPARTMENT_OTHER): Payer: Self-pay | Admitting: Orthopedic Surgery

## 2016-08-26 NOTE — Op Note (Signed)
Date of Surgery: 10/25/2016  INDICATIONS: The patient is a 26 y.o.-year-old male with right shoulder pain that has failed conservative treatment; he has had recalcitrant right shoulder pain with impingement signs noted for the past 4-5 months. We have thus far manage this with 2 sub-acromial steroid injections as well as physical therapy. We proceeded to get an MRI which did show an os acromiale A with subacromial impingement fluid and rotator cuff tendinosis. There was also some degenerative labral tearing noted. Based on these findings and his persistent pain elected to proceed with arthroscopic intervention. The patient did consent to the procedure after discussion of the risks and benefits.  PREOPERATIVE DIAGNOSIS: 1. Os acromiale right shoulder 2. Right shoulder labral tearing 3. Right shoulder subacromial impingement  POSTOPERATIVE DIAGNOSIS: Same.  PROCEDURE: 1. Right shoulder arthroscopy with extensive debridement 2. Right shoulder arthroscopic subacromial decompression  SURGEON: Maryan Rued, M.D.  ASSIST: None.  ANESTHESIA:  general, regional  IV FLUIDS AND URINE: See anesthesia.  ESTIMATED BLOOD LOSS: minimal mL.  IMPLANTS: None  COMPLICATIONS: None.  DESCRIPTION OF PROCEDURE: The patient was brought to the operating room and placed lateral on the operating table.  The patient had been signed prior to the procedure and this was documented. The patient had the anesthesia placed by the anesthesiologist.  A time-out was performed to confirm that this was the correct patient, site, side and location. The patient did receive antibiotics prior to the incision and was re-dosed during the procedure as needed at indicated intervals.  The patient was then moved into the lateral position with the operative extremity suspended in the fishing pole mechanism. The patient had the operative extremity prepped and draped in the standard surgical fashion.     The operative extremity was placed  in a suspension traction device with 10 pounds of pressure. Prior to hanging the arm in that position his examination under anesthesia demonstrated a negative sulcus sign 1+ anterior and posterior load and shift. Without any other instability signs. He had passive forward  elevation to 170 external rotation 70 and internal rotation 70.  Once the operative extremity was prepped and draped we began the arthroscopic procedure by establishing a posterior viewing portal.   ON THE DIAGNOSTIC ARTHROSCOPY PORTION OF THE PROCEDURE HE WAS NOTED TO HAVE SOME TYPE I SLAP TEARING OF THE SUPERIOR LABRUM. The rotator cuff was intact including the subscapularis superior and posterior cuff. Long head of the biceps tendon was in its groove with intact sling and well anchored to the superior labrum. The posterior labrum had a discoid appearance and was demonstrated extensive degenerative tearing from 12:00 all the way to 7:00 on this right shoulder. There were no loose bodies and no arthrosis noted of the glenohumeral joint. The anterior rotator interval has some degeneration and was hyperemic. On the subacromial portion of this arthroscopic procedure there was noted to be hyperemic bursal tissue as well as rotator cuff tissue just beneath a type II acromial spur. The rotator cuff was intact on the bursal side as well without tears.  During the operative procedure after diagnostic arthroscopy a mid anterior portal was established in the rotator interval. This was used as a working portal throughout the entire procedure. Extensive debridement of the glenohumeral joint was performed by debriding both the anterior labrum and biceps anchor as well as the degenerative and discoid posterior labral tissue. The rotator interval tissue was also debrided. Electrocautery device was used to establish hemostasis before leaving the joint and going into  the subacromial space.    next we entered the subacromial space through the posterior viewing  portal. We then established a lateral working portal about 4 cm lateral to the lateral border of the acromion in the midpoint of the acromioclavicular joint. An extensive bursectomy was performed in the subacromial space using the shaver and radio frequency device. Once the space was adequately cleared we were able to evaluate the rotator cuff tendon which itself demonstrated that it was intact without tears. Next we peeled the CA ligament off of the undersurface of the acromion but did not perform a full resection. This demonstrated a subacromial spur that correlated with an area of hyperemia and erythema at the rotator cuff tendon. Next we used a bur to turn the type II acromion into a type I and this demonstrated very nice opening of the subacromial space for the rotator cuff.   Pictures were taken the scope initial mentation were removed from the subacromial space. Portal Reilly are closed with 3-0 Monocryl interrupted stitches. A sterile dressing was applied and a simple sling. Patient did tolerate the procedure well without any immediate competitions. All counts were correct 2.  He was transferred to PACU in stable condition.      POSTOPERATIVE PLAN:  Andrew SitesDarius A Reilly will be sent home from PACU in his sling.  Sling is just to be worn for comfort. May remove that for activity as tolerated once his block wears off. We will plan on seeing him back in the office in 2 weeks for a wound check. He will begin physical therapy in 1 week.

## 2016-12-26 NOTE — Addendum Note (Signed)
Addendum  created 12/26/16 2036 by Marcene DuosFitzgerald, Epifanio Labrador, MD   Sign clinical note

## 2021-10-06 ENCOUNTER — Emergency Department (HOSPITAL_BASED_OUTPATIENT_CLINIC_OR_DEPARTMENT_OTHER): Payer: Managed Care, Other (non HMO)

## 2021-10-06 ENCOUNTER — Encounter (HOSPITAL_BASED_OUTPATIENT_CLINIC_OR_DEPARTMENT_OTHER): Payer: Self-pay

## 2021-10-06 ENCOUNTER — Emergency Department (HOSPITAL_BASED_OUTPATIENT_CLINIC_OR_DEPARTMENT_OTHER): Payer: Managed Care, Other (non HMO) | Admitting: Radiology

## 2021-10-06 ENCOUNTER — Emergency Department (HOSPITAL_BASED_OUTPATIENT_CLINIC_OR_DEPARTMENT_OTHER)
Admission: EM | Admit: 2021-10-06 | Discharge: 2021-10-06 | Disposition: A | Discharge status: Home / Self Care | Payer: Managed Care, Other (non HMO) | Attending: Emergency Medicine | Admitting: Emergency Medicine

## 2021-10-06 ENCOUNTER — Other Ambulatory Visit: Payer: Self-pay

## 2021-10-06 DIAGNOSIS — I82621 Acute embolism and thrombosis of deep veins of right upper extremity: Secondary | ICD-10-CM | POA: Insufficient documentation

## 2021-10-06 MED ORDER — RIVAROXABAN (XARELTO) VTE STARTER PACK (15 & 20 MG): 15.0000 mg | ORAL_TABLET | Freq: Two times a day (BID) | ORAL | 0 refills | Status: DC

## 2021-10-06 MED ORDER — ENOXAPARIN SODIUM 30 MG/0.3ML IJ SOSY
30.0000 mg | PREFILLED_SYRINGE | Freq: Once | INTRAMUSCULAR | Status: AC
  Administered 2021-10-06: 30 mg via SUBCUTANEOUS
  Filled 2021-10-06: qty 0.3

## 2021-10-06 NOTE — ED Provider Notes (Signed)
?[facility] EM[facility] DEPT ?Provider Note ? ? ?CSN: [id number] ?Arrival date & time: 10/06/21  1837 ? ?  ? ?History ? ?Chief Complaint  ?Patient presents with  ? Arm Swelling  ? ? ?Andrew Reilly is a 31 y.o. male with PMH significant for previous labral tear repair of the right shoulder ~5 years ago who presents for right arm pain / swelling since Saturday / Sunday. Patient with no previous history of blood clots, does not take any supplement hormones, no known history of cancer, clotting disorder, recent covid or other DVT/VTE risk factors. No recent injury to right arm. He reports that he had started working out again after not having worked out for a few months previously. He denies chest pain, hemoptysis, shob, heart racing, lightheadedness. ? ?HPI ? ?  ? ?Home Medications ?Prior to Admission medications   ?Medication Sig Start Date End Date Taking? Authorizing Provider  ?RIVAROXABAN (XARELTO) VTE STARTER PACK (15 & 20 MG) Take 15 mg by mouth 2 (two) times daily. Follow package directions: Take one 15mg  tablet by mouth twice a day. On day 22, switch to one 20mg  tablet once a day. Take with food. 10/06/21  Yes Valetta Mulroy H, PA-C  ?HYDROcodone-acetaminophen (NORCO) 7.5-325 MG tablet Take 1-2 tablets by mouth every 6 (six) hours as needed for moderate pain. 08/25/16   Yolonda Kidaogers, Jason Patrick, MD  ?ondansetron (ZOFRAN ODT) 4 MG disintegrating tablet Take 1 tablet (4 mg total) by mouth every 8 (eight) hours as needed for nausea or vomiting. 08/25/16   Yolonda Kidaogers, Jason Patrick, MD  ?   ? ?Allergies    ?Patient has no known allergies.   ? ?Review of Systems   ?Review of Systems  ?Musculoskeletal:  Positive for joint swelling and myalgias.  ?All other systems reviewed and are negative. ? ?Physical Exam ?Updated Vital Signs ?BP (!) 134/106 (BP Location: Right Arm)   Pulse 72   Temp 97.8 ?F (36.6 ?C)   Resp 16   Ht 5\' 8"  (1.727 m)   Wt 80.3 kg   SpO2 97%   BMI 26.92 kg/m?  ?Physical Exam ?Vitals and  nursing note reviewed.  ?Constitutional:   ?   General: He is not in acute distress. ?   Appearance: Normal appearance.  ?HENT:  ?   Head: Normocephalic and atraumatic.  ?Eyes:  ?   General:     ?   Right eye: No discharge.     ?   Left eye: No discharge.  ?Cardiovascular:  ?   Rate and Rhythm: Normal rate and regular rhythm.  ?Pulmonary:  ?   Effort: Pulmonary effort is normal. No respiratory distress.  ?Musculoskeletal:     ?   General: No deformity.  ?   Comments: Patient with swelling of the bicep, forearm of the right arm > left. Intact ROM. No redness overlying joints to suggest septic arthritis. Intact strength 5/5.  ?Skin: ?   General: Skin is warm and dry.  ?Neurological:  ?   Mental Status: He is alert and oriented to person, place, and time.  ?Psychiatric:     ?   Mood and Affect: Mood normal.     ?   Behavior: Behavior normal.  ? ? ?ED Results / Procedures / Treatments   ?Labs ?(all labs ordered are listed, but only abnormal results are displayed) ?Labs Reviewed - No data to display ? ?EKG ?None ? ?Radiology ?DG Chest 2 View ? ?Result Date: 10/06/2021 ?CLINICAL DATA:  Shortness of breath  EXAM: CHEST - 2 VIEW COMPARISON:  None. FINDINGS: The heart size and mediastinal contours are within normal limits. Both lungs are clear. The visualized skeletal structures are unremarkable. IMPRESSION: Normal study. Electronically Signed   By: Charlett Nose M.D.   On: 10/06/2021 22:04  ? ?US Venous Img Upper Uni Right(DVT) ? ?Result Date: 10/06/2021 ?CLINICAL DATA:  Right arm swelling EXAM: RIGHT UPPER EXTREMITY VENOUS DOPPLER ULTRASOUND TECHNIQUE: Gray-scale sonography with graded compression, as well as color Doppler and duplex ultrasound were performed to evaluate the upper extremity deep venous system from the level of the subclavian vein and including the jugular, axillary, basilic, radial, ulnar and upper cephalic vein. Spectral Doppler was utilized to evaluate flow at rest and with distal augmentation maneuvers.  COMPARISON:  None. FINDINGS: Contralateral Subclavian Vein: Respiratory phasicity is normal and symmetric with the symptomatic side. No evidence of thrombus. Normal compressibility. Internal Jugular Vein: No evidence of thrombus. Normal compressibility, respiratory phasicity and response to augmentation. Right upper extremity veins: There is occlusive mildly expansile thrombus within the right subclavian vein, axillary vein, basilic vein centrally, and 1 of the 2 paired brachial veins centrally. The radial and ulnar veins of the forearm as well as the cephalic vein of the upper extremity are patent. IMPRESSION: Extensive occlusive DVT within the right upper extremity as described above. Electronically Signed   By: Helyn Numbers M.D.   On: 10/06/2021 20:42   ? ?Procedures ?Procedures  ? ? ?Medications Ordered in ED ?Medications  ?enoxaparin (LOVENOX) injection 30 mg (30 mg Subcutaneous Given 10/06/21 2202)  ? ? ?ED Course/ Medical Decision Making/ A&P ?  ?                        ?Medical Decision Making ?Amount and/or Complexity of Data Reviewed ?Radiology: ordered. ? ?Risk ?Prescription drug management. ? ? ?This patient presents to the ED for concern of right arm swelling, this involves an extensive number of treatment options, and is a complaint that carries with it a high risk of complications and morbidity. The emergent differential diagnosis prior to evaluation includes, but is not limited to,  acute DVT, TOS, myalgias, cellulitis vs. other. Concern of whether patient may already have developed PE if he has DVT, however patient with no sx/sx of acute PE at this time. ? ?This is not an exhaustive differential.  ? ?Past Medical History / Co-morbidities / Social History: ?Previous surgery on right shoulder ? ?Additional history: ?Chart reviewed. Pertinent results include: orthopedic notes for previous right shoulder surger ? ?Physical Exam: ?Physical exam performed. The pertinent findings include: swelling noted  to RUE. Intact radial / ulnar pulses. Intact strength / ROM. ?  ?Imaging Studies: ?I ordered imaging studies including [place] of RUE. I independently visualized and interpreted imaging which showed significant thrombus involving multiple veins in the RUE. Subclavian, axillary, basilic, brachial veins involved. I agree with the radiologist interpretation. ?  ? ?Consultations Obtained: ?I requested consultation with the vascular surgeon, Dr. Myra Gianotti,  and discussed lab and imaging findings as well as pertinent plan - they recommend: lovenox, beginning xarelto, close follow up for surgical management within the next two days ?  ?Disposition: ?After consideration of the diagnostic results and the patients response to treatment, I feel that patient would benefit from vascular surgery intervention. On my evaluation patient with no active signs of PE. No tachycardia, hemoptysis, SHOB, chest pain. We will hold off on additional labwork, PE study at this time. Extensive return precautions  given.  ? ?I discussed this case with my attending physician Dr. Dalene Seltzer who cosigned this note including patient's presenting symptoms, physical exam, and planned diagnostics and interventions. Attending physician stated agreement with plan or made changes to plan which were implemented.   ? ?Final Clinical Impression(s) / ED Diagnoses ?Final diagnoses:  ?Acute deep vein thrombosis (DVT) of right upper extremity, unspecified vein (HCC)  ? ? ?Rx / DC Orders ?ED Discharge Orders   ? ?      Ordered  ?  RIVAROXABAN (XARELTO) VTE STARTER PACK (15 & 20 MG)  2 times daily       ? 10/06/21 2143  ? ?  ?  ? ?  ? ? ?  ?Olene Floss, PA-C ?10/07/21 0011 ? ?  ?Alvira Monday, MD ?10/07/21 1056 ? ?

## 2021-10-06 NOTE — ED Triage Notes (Signed)
Patient here POV from Home. ? ?Patient endorses Right Arm Swelling for approximately 5 days. Worsening since it began. ? ?No Pain. No Fevers. No Acute Trauma/Injury. ? ?NAD Noted during Triage. A&Ox4. GCS 15. Ambulatory. ?

## 2021-10-06 NOTE — Discharge Instructions (Addendum)
The radiology report of your arm shows: ?There is occlusive mildly expansile  ?thrombus within the right subclavian vein, axillary vein, basilic  ?vein centrally, and 1 of the 2 paired brachial veins centrally  ?Please go pick up the Xarelto which is an anticoagulant to begin taking 1 tablet tonight, another tomorrow morning as described.  Please follow-up at 10:30 AM with the vascular surgeon as we discussed. ? ?If you have shortness of breath, begin to cough up blood, feel your heart rate is elevated please return to the emergency department for further evaluation. ?

## 2021-10-07 ENCOUNTER — Encounter: Payer: Self-pay | Admitting: Surgery

## 2021-10-07 ENCOUNTER — Ambulatory Visit: Payer: Self-pay | Admitting: Surgery

## 2021-10-07 ENCOUNTER — Inpatient Hospital Stay (HOSPITAL_COMMUNITY)
Admission: AD | Admit: 2021-10-07 | Discharge: 2021-10-08 | DRG: 301 | Disposition: A | Discharge status: Home / Self Care | Payer: Managed Care, Other (non HMO) | Attending: Vascular Surgery | Admitting: Vascular Surgery

## 2021-10-07 ENCOUNTER — Encounter (HOSPITAL_COMMUNITY)
Admission: AD | Disposition: A | Discharge status: Home / Self Care | Payer: Self-pay | Source: Home / Self Care | Attending: Vascular Surgery

## 2021-10-07 VITALS — BP 112/76 | HR 105 | Temp 99.4°F | Resp 20 | Ht 68.0 in | Wt 171.9 lb

## 2021-10-07 DIAGNOSIS — I82A11 Acute embolism and thrombosis of right axillary vein: Secondary | ICD-10-CM

## 2021-10-07 DIAGNOSIS — F17210 Nicotine dependence, cigarettes, uncomplicated: Secondary | ICD-10-CM | POA: Diagnosis present

## 2021-10-07 DIAGNOSIS — I82621 Acute embolism and thrombosis of deep veins of right upper extremity: Principal | ICD-10-CM | POA: Diagnosis present

## 2021-10-07 DIAGNOSIS — K219 Gastro-esophageal reflux disease without esophagitis: Secondary | ICD-10-CM | POA: Diagnosis present

## 2021-10-07 DIAGNOSIS — I82629 Acute embolism and thrombosis of deep veins of unspecified upper extremity: Principal | ICD-10-CM | POA: Diagnosis present

## 2021-10-07 HISTORY — PX: PERIPHERAL VASCULAR THROMBECTOMY: CATH118306

## 2021-10-07 LAB — CBC
HCT: 37.4 % — ABNORMAL LOW (ref 39.0–52.0)
HCT: 36.5 % — ABNORMAL LOW (ref 39.0–52.0)
Hemoglobin: 12.2 g/dL — ABNORMAL LOW (ref 13.0–17.0)
Hemoglobin: 12.4 g/dL — ABNORMAL LOW (ref 13.0–17.0)
MCH: 31.9 pg (ref 26.0–34.0)
MCH: 32.6 pg (ref 26.0–34.0)
MCHC: 32.6 g/dL (ref 30.0–36.0)
MCHC: 34 g/dL (ref 30.0–36.0)
MCV: 97.9 fL (ref 80.0–100.0)
MCV: 96.1 fL (ref 80.0–100.0)
Platelets: 338 10*3/uL (ref 150–400)
Platelets: 311 10*3/uL (ref 150–400)
RBC: 3.82 MIL/uL — ABNORMAL LOW (ref 4.22–5.81)
RBC: 3.8 MIL/uL — ABNORMAL LOW (ref 4.22–5.81)
RDW: 12.3 % (ref 11.5–15.5)
RDW: 12.3 % (ref 11.5–15.5)
WBC: 7.5 10*3/uL (ref 4.0–10.5)
WBC: 8.8 10*3/uL (ref 4.0–10.5)
nRBC: 0 % (ref 0.0–0.2)
nRBC: 0 % (ref 0.0–0.2)

## 2021-10-07 LAB — FIBRINOGEN
Fibrinogen: 325 mg/dL (ref 210–475)
Fibrinogen: 268 mg/dL (ref 210–475)

## 2021-10-07 LAB — POCT I-STAT, CHEM 8
BUN: 11 mg/dL (ref 6–20)
Calcium, Ion: 1.26 mmol/L (ref 1.15–1.40)
Chloride: 105 mmol/L (ref 98–111)
Creatinine, Ser: 0.9 mg/dL (ref 0.61–1.24)
Glucose, Bld: 102 mg/dL — ABNORMAL HIGH (ref 70–99)
HCT: 41 % (ref 39.0–52.0)
Hemoglobin: 13.9 g/dL (ref 13.0–17.0)
Potassium: 4 mmol/L (ref 3.5–5.1)
Sodium: 140 mmol/L (ref 135–145)
TCO2: 25 mmol/L (ref 22–32)

## 2021-10-07 LAB — HEPARIN LEVEL (UNFRACTIONATED)
Heparin Unfractionated: 0.33 IU/mL (ref 0.30–0.70)
Heparin Unfractionated: <0.1 IU/mL — ABNORMAL LOW (ref 0.30–0.70)

## 2021-10-07 SURGERY — PERIPHERAL VASCULAR THROMBECTOMY
Anesthesia: LOCAL | Laterality: Right

## 2021-10-07 MED ORDER — HYDRALAZINE HCL 20 MG/ML IJ SOLN: 5.0000 mg | INTRAMUSCULAR | Status: DC | PRN

## 2021-10-07 MED ORDER — SODIUM CHLORIDE 0.9 % IV SOLN: INTRAVENOUS | Status: DC

## 2021-10-07 MED ORDER — MIDAZOLAM HCL 2 MG/2ML IJ SOLN: 1.0000 mg | INTRAMUSCULAR | Status: DC | PRN

## 2021-10-07 MED ORDER — MORPHINE SULFATE (PF) 4 MG/ML IV SOLN: 5.0000 mg | INTRAVENOUS | Status: DC | PRN

## 2021-10-07 MED ORDER — SODIUM CHLORIDE 0.9% FLUSH: 3.0000 mL | INTRAVENOUS | Status: DC | PRN

## 2021-10-07 MED ORDER — SODIUM CHLORIDE 0.9% FLUSH
3.0000 mL | Freq: Two times a day (BID) | INTRAVENOUS | Status: DC
  Administered 2021-10-07 – 2021-10-08 (×3): 3 mL via INTRAVENOUS

## 2021-10-07 MED ORDER — ONDANSETRON HCL 4 MG/2ML IJ SOLN: 4.0000 mg | Freq: Four times a day (QID) | INTRAMUSCULAR | Status: DC | PRN

## 2021-10-07 MED ORDER — HEPARIN (PORCINE) IN NACL 1000-0.9 UT/500ML-% IV SOLN
INTRAVENOUS | Status: DC | PRN
  Administered 2021-10-07 (×2): 500 mL

## 2021-10-07 MED ORDER — LIDOCAINE HCL (PF) 1 % IJ SOLN
INTRAMUSCULAR | Status: AC
  Filled 2021-10-07: qty 30

## 2021-10-07 MED ORDER — HEPARIN (PORCINE) 25000 UT/250ML-% IV SOLN
1300.0000 [IU]/h | INTRAVENOUS | Status: DC
  Administered 2021-10-07: 800 [IU]/h via INTRAVENOUS
  Filled 2021-10-07: qty 250

## 2021-10-07 MED ORDER — SODIUM CHLORIDE 0.9 % IV SOLN: 250.0000 mL | INTRAVENOUS | Status: DC | PRN

## 2021-10-07 MED ORDER — HYDROCODONE-ACETAMINOPHEN 7.5-325 MG PO TABS
1.0000 | ORAL_TABLET | Freq: Four times a day (QID) | ORAL | Status: DC | PRN
  Filled 2021-10-07: qty 2

## 2021-10-07 MED ORDER — LABETALOL HCL 5 MG/ML IV SOLN: 10.0000 mg | INTRAVENOUS | Status: DC | PRN

## 2021-10-07 MED ORDER — SODIUM CHLORIDE 0.9 % IV SOLN
1.0000 mg/h | INTRAVENOUS | Status: DC
  Administered 2021-10-07 – 2021-10-08 (×3): 1 mg/h
  Filled 2021-10-07 (×7): qty 10

## 2021-10-07 MED ORDER — FENTANYL CITRATE (PF) 100 MCG/2ML IJ SOLN
INTRAMUSCULAR | Status: DC | PRN
  Administered 2021-10-07: 25 ug via INTRAVENOUS

## 2021-10-07 MED ORDER — HEPARIN (PORCINE) IN NACL 1000-0.9 UT/500ML-% IV SOLN
INTRAVENOUS | Status: AC
  Filled 2021-10-07: qty 1000

## 2021-10-07 MED ORDER — MIDAZOLAM HCL 2 MG/2ML IJ SOLN
INTRAMUSCULAR | Status: AC
  Filled 2021-10-07: qty 2

## 2021-10-07 MED ORDER — HEPARIN SODIUM (PORCINE) 1000 UNIT/ML IJ SOLN
INTRAMUSCULAR | Status: AC
  Filled 2021-10-07: qty 10

## 2021-10-07 MED ORDER — FENTANYL CITRATE (PF) 100 MCG/2ML IJ SOLN
INTRAMUSCULAR | Status: AC
  Filled 2021-10-07: qty 2

## 2021-10-07 MED ORDER — LIDOCAINE HCL (PF) 1 % IJ SOLN
INTRAMUSCULAR | Status: DC | PRN
  Administered 2021-10-07: 2 mL

## 2021-10-07 MED ORDER — IODIXANOL 320 MG/ML IV SOLN
INTRAVENOUS | Status: DC | PRN
  Administered 2021-10-07: 30 mL

## 2021-10-07 MED ORDER — HEPARIN SODIUM (PORCINE) 1000 UNIT/ML IJ SOLN
INTRAMUSCULAR | Status: DC | PRN
  Administered 2021-10-07: 7000 [IU] via INTRAVENOUS

## 2021-10-07 MED ORDER — MIDAZOLAM HCL 2 MG/2ML IJ SOLN
INTRAMUSCULAR | Status: DC | PRN
  Administered 2021-10-07: 1 mg via INTRAVENOUS

## 2021-10-07 SURGICAL SUPPLY — 9 items
CATH ANGIO 5F BER2 65CM (CATHETERS) ×1 IMPLANT
CATH PULSE SPRAY 45CMX15CM 5FR (CATHETERS) ×1 IMPLANT
KIT MICROPUNCTURE NIT STIFF (SHEATH) ×1 IMPLANT
KIT PV (KITS) ×1 IMPLANT
SHEATH PINNACLE 6F 10CM (SHEATH) ×1 IMPLANT
SHEATH PROBE COVER 6X72 (BAG) ×1 IMPLANT
STOPCOCK MORSE 400PSI 3WAY (MISCELLANEOUS) ×1 IMPLANT
TRAY PV CATH (CUSTOM PROCEDURE TRAY) ×1 IMPLANT
WIRE BENTSON .035X145CM (WIRE) ×1 IMPLANT

## 2021-10-07 NOTE — Plan of Care (Signed)

## 2021-10-07 NOTE — Op Note (Signed)
? ? ?  Patient name: Andrew Reilly MRN: [id number] DOB: 1990-12-03 Sex: male ? 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  Ultrasound-guided access right basilic vein ?2.  Right upper extremity venogram ?3.  Placement of thrombolytics catheter in the right basilic, brachial, axillary and subclavian vein with initiation of chemical thrombolysis using tPA ?4.  27 minutes of monitored moderate conscious sedation time ? ?Contrast: 30 mL ? ?Indications: Patient is a 31 year old male who presented to [facility] ED yesterday with extensive swelling in the right arm for several days and is a very active weightlifter and ultimately found to have extensive right arm DVT.  He was seen by Dr. Myra Gianotti in the office today.  He presents for initiation of thrombolysis after risk benefits discussed. ? ?Findings:  ? ?Ultrasound-guided access right basilic vein that was patent by ultrasound.  Right upper extremity venogram showed acute thrombus in the upper arm brachial veins as well as axillary and subclavian vein.  The right innominate vein and superior vena cava are widely patent.  There is evidence of a high-grade stenosis>80% in the proximal right subclavian vein at the first rib consistent with venous TOS. ?  ?Procedure:  The patient was identified in the holding area and taken to room 8.  The patient was then placed supine on the table and prepped and draped in the usual sterile fashion.  A time out was called.  Shows used to evaluate the right basilic vein in the upper arm near the antecubital crease.  This was patent and image was saved.  I then accessed this under ultrasound guidance with a micro access needle and placed a microwire and then a microsheath.  I then used a Bentson wire and then exchanged for a short 6 [place] sheath.  Patient was given 100  units/kg IV heparin.  I then performed hand-injection through the BER 2 catheter placed over the wire showing acute thrombus in the upper arm brachial veins as well as axillary and subclavian vein.  There was a high-grade stenosis in the proximal subclavian vein on the right.  The right innominate vein and superior vena cava were widely patent.  Ultimately I then crossed all the thrombus and stenosis with a Bentson wire and got my wire into the SVC.  I then placed a short 45 cm UniFuse catheter with a 15 cm infusion length across the right axillary and subclavian vein from a basilic approach.  We will run tPA at 1 mg/hr and heparin at 800 units/hr through the sheath.  Will be taken to ICU in stable condition. ? ?Plan: Patient will go to the ICU for right extremity thrombolysis.  Will return tomorrow to the Cath Lab for thrombolytics catheter check. ? ? ?Cephus Shelling, MD ?[facility] ?Office: [phone] ? ? ?

## 2021-10-07 NOTE — Progress Notes (Signed)
ANTICOAGULATION CONSULT NOTE - Initial Consult ? ?Pharmacy Consult for IV Heparin ?Indication: DVT ? ?No Known Allergies ? ?Patient Measurements: ?Height: 5\' 8"  (172.7 cm) ?Weight: 77.6 kg (171 lb) ?IBW/kg (Calculated) : 68.4 ?Heparin Dosing Weight: 77.6 kg ? ?Vital Signs: ?Temp: 98.1 ?F (36.7 ?C) (04/27 2000) ?Temp Source: Oral (04/27 1456) ?BP: 124/63 (04/27 2100) ?Pulse Rate: 82 (04/27 2100) ? ?Labs: ?Recent Labs  ?  10/07/21 ?1316 10/07/21 ?1540 10/07/21 ?2122  ?HGB 13.9 12.2* 12.4*  ?HCT 41.0 37.4* 36.5*  ?PLT  --  338 311  ?HEPARINUNFRC  --  0.33 <0.10*  ?CREATININE 0.90  --   --   ? ? ?Estimated Creatinine Clearance: 116.1 mL/min (by C-G formula based on SCr of 0.9 mg/dL). ? ? ?Medical History: ?Past Medical History:  ?Diagnosis Date  ? DVT (deep venous thrombosis) (HCC)   ? GERD (gastroesophageal reflux disease)   ? Impingement syndrome of right shoulder   ? Partial tear of right rotator cuff   ? ? ?Medications:  ?Infusions:  ? sodium chloride    ? sodium chloride 75 mL/hr at 10/07/21 2000  ? alteplase (LIMB ISCHEMIA) 10 mg in normal saline (0.02 mg/mL) infusion 1 mg/hr (10/07/21 2000)  ? heparin 800 Units/hr (10/07/21 2000)  ? ? ?Assessment: ?31 years of age male with right arm DVT receiving catheter directed lysis with alteplase at 1mg /hr and heparin infusion currently at 800 units/hr.  ? ?Pharmacy consult for heparin dosing for goal of low heparin level of 0.2 to 0.5. Current heparin rate providing ~10 units/kg/hr and initiated at 1442 PM.  ? ?HL <0.10 -- down on Heparin 800 units/hr ? ?Goal of Therapy:  ?Heparin level 0.2 to 0.5 units/ml ?Monitor platelets by anticoagulation protocol: Yes ?  ?Plan:  ?Increase Heparin to rate of  1000 units/hr ( 10 mL/hr).  ?Check heparin level every 6 hours on lysis  ?Daily heparin level and CBC while on therapy.  ?Monitor lysis and catheter removal.  ?Follow-up plan for oral anticoagulation.  ? ?26, PharmD, BCPS, BCCCP ?Clinical Pharmacist ?Please refer to  [facility] for [facility] numbers ?10/07/2021,10:28 PM ? ? ?

## 2021-10-07 NOTE — H&P (Signed)
[facility] [place] ?  ?Patient name: Andrew Reilly          MRN: [id number]        DOB: 30-Oct-1990          Sex: male ?  ?  ?REQUESTING PROVIDER:  ?  ? ER ?  ?  ?REASON FOR CONSULT:  ?  ?Right Arm DVT ?  ?HISTORY OF PRESENT ILLNESS:  ?  ?Andrew Reilly is a 31 y.o. male, who presented to [facility] emergency department last night with complaints of swelling in the right arm for several days.  He is very active with weightlifting.  There is no family history of DVT.  He has never had a prior episode of DVT.  He states that whenever he starts using his arm even with minimal activity, he gets very swollen.  He does have a history of right shoulder arthroscopy about 5 years ago.  He was given a dose of Lovenox in the ER last night, however this was a 30 mg dose.  He was sent to get Xarelto however this cost $2000 and so he did not get it ?  ?PAST MEDICAL HISTORY  ?  ?  ?    ?Past Medical History:  ?Diagnosis Date  ? DVT (deep venous thrombosis) (HCC)    ? GERD (gastroesophageal reflux disease)    ? Impingement syndrome of right shoulder    ? Partial tear of right rotator cuff    ?  ?  ?  ?FAMILY HISTORY  ?  ?No family history on file. ?  ?SOCIAL HISTORY:  ?  ?Social History  ?  ?     ?Socioeconomic History  ? Marital status: Single  ?    Spouse name: Not on file  ? Number of children: Not on file  ? Years of education: Not on file  ? Highest education level: Not on file  ?Occupational History  ? Not on file  ?Tobacco Use  ? Smoking status: Every Day  ?    Packs/day: 0.25  ?    Years: 5.00  ?    Pack years: 1.25  ?    Types: Cigarettes  ? Smokeless tobacco: Never  ?Vaping Use  ? Vaping Use: Never used  ?Substance and Sexual Activity  ? Alcohol use: No  ? Drug use: No  ? Sexual activity: Not on file  ?Other Topics Concern  ? Not on file  ?Social History Narrative  ? Not on file  ?  ?Social Determinants of Health  ?  ?Financial Resource Strain: Not on file  ?Food Insecurity: Not on file   ?Transportation Needs: Not on file  ?Physical Activity: Not on file  ?Stress: Not on file  ?Social Connections: Not on file  ?Intimate Partner Violence: Not on file  ?  ?  ?ALLERGIES:  ?  ?  ?No Known Allergies ?  ?CURRENT MEDICATIONS:  ?  ?  ?      ?Current Outpatient Medications  ?Medication Sig Dispense Refill  ? HYDROcodone-acetaminophen (NORCO) 7.5-325 MG tablet Take 1-2 tablets by mouth every 6 (six) hours as needed for moderate pain. 45 tablet 0  ? ondansetron (ZOFRAN ODT) 4 MG disintegrating tablet Take 1 tablet (4 mg total) by mouth every 8 (eight) hours as needed for nausea or vomiting. 20 tablet 0  ? RIVAROXABAN (XARELTO) VTE STARTER PACK (15 & 20 MG) Take 15 mg by mouth 2 (two) times daily. Follow package directions: Take one  15mg  tablet by mouth twice a day. On day 22, switch to one 20mg  tablet once a day. Take with food. (Patient not taking: Reported on 10/07/2021) 51 each 0  ?  ?No current facility-administered medications for this visit.  ?  ?  ?REVIEW OF SYSTEMS:  ?  ?[X]  denotes positive finding, [ ]  denotes negative finding ?Cardiac   Comments:  ?Chest pain or chest pressure:      ?Shortness of breath upon exertion:      ?Short of breath when lying flat:      ?Irregular heart rhythm:      ?       ?Vascular      ?Pain in calf, thigh, or hip brought on by ambulation:      ?Pain in feet at night that wakes you up from your sleep:       ?Blood clot in your veins:      ?Leg swelling:       ?       ?Pulmonary      ?Oxygen at home:      ?Productive cough:       ?Wheezing:       ?       ?Neurologic      ?Sudden weakness in arms or legs:       ?Sudden numbness in arms or legs:       ?Sudden onset of difficulty speaking or slurred speech:      ?Temporary loss of vision in one eye:       ?Problems with dizziness:       ?       ?Gastrointestinal      ?Blood in stool:       ?  ?Vomited blood:       ?       ?Genitourinary      ?Burning when urinating:       ?Blood in urine:      ?       ?Psychiatric      ?Major  depression:       ?       ?Hematologic      ?Bleeding problems:      ?Problems with blood clotting too easily:      ?       ?Skin      ?Rashes or ulcers:      ?       ?Constitutional      ?Fever or chills:      ?  ?PHYSICAL EXAM:  ?  ?   ?Vitals:  ?  10/07/21 1042  ?BP: 112/76  ?Pulse: (!) 105  ?Resp: 20  ?Temp: 99.4 ?F (37.4 ?C)  ?SpO2: 98%  ?Weight: 171 lb 14.4 oz (78 kg)  ?Height: 5\' 8"  (1.727 m)  ?  ?  ?GENERAL: The patient is a well-nourished male, in no acute distress. The vital signs are documented above. ?CARDIAC: There is a regular rate and rhythm.  ?VASCULAR: Significant right arm swelling ?PULMONARY: Nonlabored respirations ?MUSCULOSKELETAL: There are no major deformities or cyanosis. ?NEUROLOGIC: No focal weakness or paresthesias are detected. ?SKIN: There are no ulcers or rashes noted. ?PSYCHIATRIC: The patient has a normal affect. ?  ?STUDIES:  ?  ?I have reviewed his ultrasound with the following findings: ?Right upper extremity veins: There is occlusive mildly expansile ?thrombus within the right subclavian vein, axillary vein, basilic ?vein centrally, and 1 of the 2 paired brachial veins centrally. The ?radial and ulnar  veins of the forearm as well as the cephalic vein ?of the upper extremity are patent. ?  ?ASSESSMENT and PLAN  ?  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  I discussed going to the hospital today for thrombolysis.  This will require overnight stay and likely repeat procedure tomorrow.  Also discussed that we will need to consider first rib resection in order to prevent this from being a recurrent problem.  While he is in the hospital, he will need assistance with finding the right anticoagulation medication from a cost perspective.  All questions were answered.  He has been n.p.o. since last night. ?  ?  ?Annamarie Major, IV, MD, FACS ?[facility] [place] ?Tel [phone] ?Pager [phone]  ?

## 2021-10-07 NOTE — Progress Notes (Signed)
? ?[facility] [place] ? ?Patient name: Andrew Reilly MRN: [id number] DOB: 02-Oct-1990 Sex: male ? ? ?REQUESTING PROVIDER:  ? ? ER ? ? ?REASON FOR CONSULT:  ?  ?Right Arm DVT ? ?HISTORY OF PRESENT ILLNESS:  ? ?Andrew Reilly is a 31 y.o. male, who presented to [facility] emergency department last night with complaints of swelling in the right arm for several days.  He is very active with weightlifting.  There is no family history of DVT.  He has never had a prior episode of DVT.  He states that whenever he starts using his arm even with minimal activity, he gets very swollen.  He does have a history of right shoulder arthroscopy about 5 years ago.  He was given a dose of Lovenox in the ER last night, however this was a 30 mg dose.  He was sent to get Xarelto however this cost $2000 and so he did not get it ? ?PAST MEDICAL HISTORY  ? ? ?Past Medical History:  ?Diagnosis Date  ? DVT (deep venous thrombosis) (HCC)   ? GERD (gastroesophageal reflux disease)   ? Impingement syndrome of right shoulder   ? Partial tear of right rotator cuff   ? ? ? ?FAMILY HISTORY  ? ?No family history on file. ? ?SOCIAL HISTORY:  ? ?Social History  ? ?Socioeconomic History  ? Marital status: Single  ?  Spouse name: Not on file  ? Number of children: Not on file  ? Years of education: Not on file  ? Highest education level: Not on file  ?Occupational History  ? Not on file  ?Tobacco Use  ? Smoking status: Every Day  ?  Packs/day: 0.25  ?  Years: 5.00  ?  Pack years: 1.25  ?  Types: Cigarettes  ? Smokeless tobacco: Never  ?Vaping Use  ? Vaping Use: Never used  ?Substance and Sexual Activity  ? Alcohol use: No  ? Drug use: No  ? Sexual activity: Not on file  ?Other Topics Concern  ? Not on file  ?Social History Narrative  ? Not on file  ? ?Social Determinants of Health  ? ?Financial Resource Strain: Not on file  ?Food Insecurity: Not on file  ?Transportation Needs: Not on file   ?Physical Activity: Not on file  ?Stress: Not on file  ?Social Connections: Not on file  ?Intimate Partner Violence: Not on file  ? ? ?ALLERGIES:  ? ? ?No Known Allergies ? ?CURRENT MEDICATIONS:  ? ? ?Current Outpatient Medications  ?Medication Sig Dispense Refill  ? HYDROcodone-acetaminophen (NORCO) 7.5-325 MG tablet Take 1-2 tablets by mouth every 6 (six) hours as needed for moderate pain. 45 tablet 0  ? ondansetron (ZOFRAN ODT) 4 MG disintegrating tablet Take 1 tablet (4 mg total) by mouth every 8 (eight) hours as needed for nausea or vomiting. 20 tablet 0  ? RIVAROXABAN (XARELTO) VTE STARTER PACK (15 & 20 MG) Take 15 mg by mouth 2 (two) times daily. Follow package directions: Take one 15mg  tablet by mouth twice a day. On day 22, switch to one 20mg  tablet once a day. Take with food. (Patient not taking: Reported on 10/07/2021) 51 each 0  ? ?No current facility-administered medications for this visit.  ? ? ?REVIEW OF SYSTEMS:  ? ?[X]  denotes positive finding, [ ]  denotes negative finding ?Cardiac  Comments:  ?Chest pain or chest pressure:    ?Shortness of breath upon exertion:    ?Short of breath when  lying flat:    ?Irregular heart rhythm:    ?    ?Vascular    ?Pain in calf, thigh, or hip brought on by ambulation:    ?Pain in feet at night that wakes you up from your sleep:     ?Blood clot in your veins:    ?Leg swelling:     ?    ?Pulmonary    ?Oxygen at home:    ?Productive cough:     ?Wheezing:     ?    ?Neurologic    ?Sudden weakness in arms or legs:     ?Sudden numbness in arms or legs:     ?Sudden onset of difficulty speaking or slurred speech:    ?Temporary loss of vision in one eye:     ?Problems with dizziness:     ?    ?Gastrointestinal    ?Blood in stool:     ? ?Vomited blood:     ?    ?Genitourinary    ?Burning when urinating:     ?Blood in urine:    ?    ?Psychiatric    ?Major depression:     ?    ?Hematologic    ?Bleeding problems:    ?Problems with blood clotting too easily:    ?    ?Skin     ?Rashes or ulcers:    ?    ?Constitutional    ?Fever or chills:    ? ?PHYSICAL EXAM:  ? ?Vitals:  ? 10/07/21 1042  ?BP: 112/76  ?Pulse: (!) 105  ?Resp: 20  ?Temp: 99.4 ?F (37.4 ?C)  ?SpO2: 98%  ?Weight: 171 lb 14.4 oz (78 kg)  ?Height: 5\' 8"  (1.727 m)  ? ? ?GENERAL: The patient is a well-nourished male, in no acute distress. The vital signs are documented above. ?CARDIAC: There is a regular rate and rhythm.  ?VASCULAR: Significant right arm swelling ?PULMONARY: Nonlabored respirations ?MUSCULOSKELETAL: There are no major deformities or cyanosis. ?NEUROLOGIC: No focal weakness or paresthesias are detected. ?SKIN: There are no ulcers or rashes noted. ?PSYCHIATRIC: The patient has a normal affect. ? ?STUDIES:  ? ?I have reviewed his ultrasound with the following findings: ?Right upper extremity veins: There is occlusive mildly expansile ?thrombus within the right subclavian vein, axillary vein, basilic ?vein centrally, and 1 of the 2 paired brachial veins centrally. The ?radial and ulnar veins of the forearm as well as the cephalic vein ?of the upper extremity are patent. ? ?ASSESSMENT and PLAN  ? 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  I discussed going to the hospital today for thrombolysis.  This will require overnight stay and likely repeat procedure tomorrow.  Also discussed that we will need to consider first rib resection in order to prevent this from being a recurrent problem.  While he is in the hospital, he will need assistance with finding the right anticoagulation medication from a cost perspective.  All questions were answered.  He has been n.p.o. since last night. ? ? ? , IV, MD, FACS ?[facility] [place] ?Tel [phone] ?Pager [phone]  ?

## 2021-10-07 NOTE — Care Management (Signed)
0539 10-07-21 Case Manager received a consult for medication assistance. Pharmacy tech to verify insurance since benefits check has been submitted. Patient is not listed as having  insurance or a PCP. Case Manager will follow up with the patient in the morning to see if the patient has a PCP; if not Case Manager will schedule a hospital follow up appointment for PCP needs. No further needs identified at this time.  ?

## 2021-10-07 NOTE — Progress Notes (Addendum)
ANTICOAGULATION CONSULT NOTE - Initial Consult ? ?Pharmacy Consult for IV Heparin ?Indication: DVT ? ?No Known Allergies ? ?Patient Measurements: ?Height: 5\' 8"  (172.7 cm) ?Weight: 77.6 kg (171 lb) ?IBW/kg (Calculated) : 68.4 ?Heparin Dosing Weight: 77.6 kg ? ?Vital Signs: ?Temp: 98.5 ?F (36.9 ?C) (04/27 1456) ?Temp Source: Oral (04/27 1456) ?BP: 155/68 (04/27 1429) ?Pulse Rate: 76 (04/27 1429) ? ?Labs: ?Recent Labs  ?  10/07/21 ?1316  ?HGB 13.9  ?HCT 41.0  ?CREATININE 0.90  ? ? ?Estimated Creatinine Clearance: 116.1 mL/min (by C-G formula based on SCr of 0.9 mg/dL). ? ? ?Medical History: ?Past Medical History:  ?Diagnosis Date  ? DVT (deep venous thrombosis) (HCC)   ? GERD (gastroesophageal reflux disease)   ? Impingement syndrome of right shoulder   ? Partial tear of right rotator cuff   ? ? ?Medications:  ?Infusions:  ? sodium chloride    ? alteplase (LIMB ISCHEMIA) 10 mg in normal saline (0.02 mg/mL) infusion 1 mg/hr (10/07/21 1440)  ? heparin 800 Units/hr (10/07/21 1442)  ? ? ?Assessment: ?31 years of age male with right arm DVT receiving catheter directed lysis with alteplase at 1mg /hr and heparin infusion currently at 800 units/hr.  ? ?Pharmacy consult for heparin dosing for goal of low heparin level of 0.2 to 0.5 . Current heparin rate providing ~10 units/kg/hr and initiated at 1442 PM.  ? ?Addendum: HL 0.33 after ~1 hr of infusion. Received 7000 unit bolus in IR so likely all bolus effect. Will follow-up at steady state in 6 hours.  ? ?Goal of Therapy:  ?Heparin level 0.2 to 0.5 units/ml ?Monitor platelets by anticoagulation protocol: Yes ?  ?Plan:  ?Continue Heparin at current rate of 800 units/hr (8 mL/hr).  ?Check heparin level every 6 hours on lysis  ?Daily heparin level and CBC while on therapy.  ?Monitor lysis and catheter removal.  ?Follow-up plan for oral anticoagulation.  ? ?26, PharmD, BCPS, BCCCP ?Clinical Pharmacist ?Please refer to [facility] for [facility] numbers ?10/07/2021,3:01  PM ? ? ?

## 2021-10-08 ENCOUNTER — Other Ambulatory Visit (HOSPITAL_COMMUNITY): Payer: Self-pay

## 2021-10-08 ENCOUNTER — Ambulatory Visit (HOSPITAL_COMMUNITY)
Admission: RE | Admit: 2021-10-08 | Payer: Managed Care, Other (non HMO) | Source: Home / Self Care | Admitting: Vascular Surgery

## 2021-10-08 ENCOUNTER — Encounter (HOSPITAL_COMMUNITY): Payer: Self-pay | Admitting: Vascular Surgery

## 2021-10-08 ENCOUNTER — Encounter (HOSPITAL_COMMUNITY)
Admission: AD | Disposition: A | Discharge status: Home / Self Care | Payer: Self-pay | Source: Home / Self Care | Attending: Vascular Surgery

## 2021-10-08 HISTORY — PX: PERIPHERAL VASCULAR THROMBECTOMY: CATH118306

## 2021-10-08 LAB — BASIC METABOLIC PANEL
Anion gap: 6 (ref 5–15)
BUN: 9 mg/dL (ref 6–20)
CO2: 22 mmol/L (ref 22–32)
Calcium: 9.1 mg/dL (ref 8.9–10.3)
Chloride: 109 mmol/L (ref 98–111)
Creatinine, Ser: 0.95 mg/dL (ref 0.61–1.24)
GFR, Estimated: >60 mL/min (ref >60)
Glucose, Bld: 89 mg/dL (ref 70–99)
Potassium: 3.7 mmol/L (ref 3.5–5.1)
Sodium: 137 mmol/L (ref 135–145)

## 2021-10-08 LAB — CBC
HCT: 36 % — ABNORMAL LOW (ref 39.0–52.0)
HCT: 36.6 % — ABNORMAL LOW (ref 39.0–52.0)
Hemoglobin: 12 g/dL — ABNORMAL LOW (ref 13.0–17.0)
Hemoglobin: 12.1 g/dL — ABNORMAL LOW (ref 13.0–17.0)
MCH: 32 pg (ref 26.0–34.0)
MCH: 31.8 pg (ref 26.0–34.0)
MCHC: 33.3 g/dL (ref 30.0–36.0)
MCHC: 33.1 g/dL (ref 30.0–36.0)
MCV: 96 fL (ref 80.0–100.0)
MCV: 96.1 fL (ref 80.0–100.0)
Platelets: 307 10*3/uL (ref 150–400)
Platelets: 288 10*3/uL (ref 150–400)
RBC: 3.75 MIL/uL — ABNORMAL LOW (ref 4.22–5.81)
RBC: 3.81 MIL/uL — ABNORMAL LOW (ref 4.22–5.81)
RDW: 12.3 % (ref 11.5–15.5)
RDW: 12.2 % (ref 11.5–15.5)
WBC: 7.4 10*3/uL (ref 4.0–10.5)
WBC: 6.6 10*3/uL (ref 4.0–10.5)
nRBC: 0 % (ref 0.0–0.2)
nRBC: 0 % (ref 0.0–0.2)

## 2021-10-08 LAB — HEPARIN LEVEL (UNFRACTIONATED)
Heparin Unfractionated: <0.1 IU/mL — ABNORMAL LOW (ref 0.30–0.70)
Heparin Unfractionated: 0.11 IU/mL — ABNORMAL LOW (ref 0.30–0.70)

## 2021-10-08 LAB — FIBRINOGEN
Fibrinogen: 251 mg/dL (ref 210–475)
Fibrinogen: 240 mg/dL (ref 210–475)

## 2021-10-08 SURGERY — PERIPHERAL VASCULAR THROMBECTOMY
Anesthesia: LOCAL | Laterality: Right

## 2021-10-08 MED ORDER — ONDANSETRON HCL 4 MG/2ML IJ SOLN: 4.0000 mg | Freq: Four times a day (QID) | INTRAMUSCULAR | Status: DC | PRN

## 2021-10-08 MED ORDER — HYDRALAZINE HCL 20 MG/ML IJ SOLN: 5.0000 mg | INTRAMUSCULAR | Status: DC | PRN

## 2021-10-08 MED ORDER — SODIUM CHLORIDE 0.9% FLUSH: 3.0000 mL | Freq: Two times a day (BID) | INTRAVENOUS | Status: DC

## 2021-10-08 MED ORDER — RIVAROXABAN (XARELTO) VTE STARTER PACK (15 & 20 MG): 15.0000 mg | ORAL_TABLET | Freq: Two times a day (BID) | ORAL | Status: DC

## 2021-10-08 MED ORDER — SODIUM CHLORIDE 0.9 % IV SOLN: 250.0000 mL | INTRAVENOUS | Status: DC | PRN

## 2021-10-08 MED ORDER — IODIXANOL 320 MG/ML IV SOLN
INTRAVENOUS | Status: DC | PRN
  Administered 2021-10-08: 10 mL

## 2021-10-08 MED ORDER — SODIUM CHLORIDE 0.9 % WEIGHT BASED INFUSION: 1.0000 mL/kg/h | INTRAVENOUS | Status: DC

## 2021-10-08 MED ORDER — HEPARIN (PORCINE) IN NACL 1000-0.9 UT/500ML-% IV SOLN
INTRAVENOUS | Status: DC | PRN
  Administered 2021-10-08: 500 mL

## 2021-10-08 MED ORDER — HEPARIN (PORCINE) IN NACL 1000-0.9 UT/500ML-% IV SOLN
INTRAVENOUS | Status: AC
  Filled 2021-10-08: qty 500

## 2021-10-08 MED ORDER — LIDOCAINE HCL (PF) 1 % IJ SOLN
INTRAMUSCULAR | Status: AC
  Filled 2021-10-08: qty 30

## 2021-10-08 MED ORDER — SODIUM CHLORIDE 0.9% FLUSH: 3.0000 mL | INTRAVENOUS | Status: DC | PRN

## 2021-10-08 MED ORDER — HEPARIN (PORCINE) 25000 UT/250ML-% IV SOLN: 1550.0000 [IU]/h | INTRAVENOUS | Status: DC

## 2021-10-08 MED ORDER — LABETALOL HCL 5 MG/ML IV SOLN: 10.0000 mg | INTRAVENOUS | Status: DC | PRN

## 2021-10-08 MED ORDER — ACETAMINOPHEN 325 MG PO TABS: 650.0000 mg | ORAL_TABLET | ORAL | Status: DC | PRN

## 2021-10-08 MED ORDER — CHLORHEXIDINE GLUCONATE CLOTH 2 % EX PADS
6.0000 | MEDICATED_PAD | Freq: Every day | CUTANEOUS | Status: DC
  Administered 2021-10-08: 6 via TOPICAL

## 2021-10-08 MED ORDER — RIVAROXABAN (XARELTO) VTE STARTER PACK (15 & 20 MG)
ORAL_TABLET | ORAL | 0 refills | Status: DC
  Filled 2021-10-08: qty 51, 30d supply, fill #0

## 2021-10-08 SURGICAL SUPPLY — 4 items
BAG SNAP BAND KOVER 36X36 (MISCELLANEOUS) ×1 IMPLANT
KIT PV (KITS) ×1 IMPLANT
TRAY PV CATH (CUSTOM PROCEDURE TRAY) ×1 IMPLANT
WIRE BENTSON .035X145CM (WIRE) ×1 IMPLANT

## 2021-10-08 NOTE — Op Note (Signed)
DATE OF SERVICE: 10/08/2021 ? ?PATIENT:  Andrew Reilly  31 y.o. male ? ?PRE-OPERATIVE DIAGNOSIS:  right upper extremity DVT, possible venous thoracic outlet syndrome ? ?POST-OPERATIVE DIAGNOSIS:  Same ? ?PROCEDURE:   ?right upper extremity and central venous venography after thrombolysis ? ?SURGEON:  Surgeon(s) and Role: ?   * Leonie Douglas, MD - Primary ? ?ASSISTANT: none ? ?ANESTHESIA:   local ? ?EBL: minimal ? ?BLOOD ADMINISTERED:none ? ?DRAINS: none  ? ?LOCAL MEDICATIONS USED:  LIDOCAINE  ? ?SPECIMEN:  none ? ?COUNTS: confirmed correct. ? ?TOURNIQUET:  none ? ?PATIENT DISPOSITION:  PACU - hemodynamically stable. ?  ?Delay start of Pharmacological VTE agent (>24hrs) due to surgical blood loss or risk of bleeding: no ? ?INDICATION FOR PROCEDURE: Andrew Reilly is a 31 y.o. male with right upper extremity DVT, possible venous thoracic outlet syndrome. After careful discussion of risks, benefits, and alternatives the patient was offered follow up lysis venography. The patient understood and wished to proceed. ? ?OPERATIVE FINDINGS: complete resolution of right proximal, central venous thrombosis after thrombolysis ? ?DESCRIPTION OF PROCEDURE: After identification of the patient in the pre-operative holding area, the patient was transferred to the operating room. The patient was positioned on the operating room table. The previously placed sheath was prepped and draped in standard fashion. A surgical pause was performed confirming correct patient, procedure, and operative location. ? ?The previously placed thrombolysis catheter was wired with a Bentson wire.  The catheter was removed over the wire.  Venograms were performed in stations over the right proximal upper extremity and the right central veins.  No evidence of persistent thrombus was noted.  Good technical result was achieved from thrombolysis.  No further maneuvers were needed.  The wire was removed.  A figure-of-eight stitch was placed around the  access point.  The sheath was removed.  This stitch was secured.  Hemostasis was achieved.  A clean bandage was applied. ? ?Upon completion of the case instrument and sharps counts were confirmed correct. The patient was transferred to the PACU in good condition. I was present for all portions of the procedure. ? ?Rande Brunt. Lenell Antu, MD ?[facility] [place] ?Office Phone Number: [phone] ?10/08/2021 2:48 PM ? ? ? ?

## 2021-10-08 NOTE — Progress Notes (Signed)
ANTICOAGULATION CONSULT NOTE  ? ?Pharmacy Consult for IV Heparin ?Indication: DVT ? ?No Known Allergies ? ?Patient Measurements: ?Height: 5\' 8"  (172.7 cm) ?Weight: 77.9 kg (171 lb 11.8 oz) ?IBW/kg (Calculated) : 68.4 ?Heparin Dosing Weight: 77.6 kg ? ?Vital Signs: ?Temp: 98.2 ?F (36.8 ?C) (04/28 0403) ?Temp Source: Oral (04/28 0403) ?BP: 92/80 (04/28 0600) ?Pulse Rate: 67 (04/28 0600) ? ?Labs: ?Recent Labs  ?  10/07/21 ?1316 10/07/21 ?1540 10/07/21 ?2122 10/08/21 ?0254  ?HGB 13.9 12.2* 12.4* 12.0*  ?HCT 41.0 37.4* 36.5* 36.0*  ?PLT  --  338 311 307  ?HEPARINUNFRC  --  0.33 <0.10* <0.10*  ?CREATININE 0.90  --   --  0.95  ? ? ? ?Estimated Creatinine Clearance: 110 mL/min (by C-G formula based on SCr of 0.95 mg/dL). ? ? ?Medical History: ?Past Medical History:  ?Diagnosis Date  ? DVT (deep venous thrombosis) ([place])   ? GERD (gastroesophageal reflux disease)   ? Impingement syndrome of right shoulder   ? Partial tear of right rotator cuff   ? ? ?Medications:  ?Infusions:  ? sodium chloride    ? sodium chloride 75 mL/hr at 10/08/21 0600  ? alteplase (LIMB ISCHEMIA) 10 mg in normal saline (0.02 mg/mL) infusion 1 mg/hr (10/08/21 0600)  ? heparin 1,300 Units/hr (10/08/21 0600)  ? ? ?Assessment: ?31 years of age male with right arm DVT receiving catheter directed lysis with alteplase at 1mg /hr and heparin infusion currently at 800 units/hr.  ? ?Pharmacy consult for heparin dosing for goal of low heparin level of 0.2 to 0.5. ? ?Heparin level this morning low this am at 0.11, now up to 1300 units/hr of heparin. Plan for thrombolytics catheter recheck later today. Hgb stable in 12s, platelet and fibrinogen also stable. Patient continues on alteplase ? ?Goal of Therapy:  ?Heparin level 0.2 to 0.5 units/ml ?Monitor platelets by anticoagulation protocol: Yes ?  ?Plan:  ?Increase Heparin to 1550 units/hr  ?Check heparin level every 6 hours on lysis  ?Daily heparin level and CBC while on therapy.  ?Monitor lysis and catheter removal.   ?Follow-up plan for oral anticoagulation.  ? ?Erin Hearing PharmD., BCPS ?Clinical Pharmacist ?10/08/2021 7:29 AM ? ? ?

## 2021-10-08 NOTE — Care Management (Addendum)
1434 10-08-21 Case Manager spoke with patient regarding insurance and PCP needs. 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 Case Manager discussed if he only needs an anticoagulant at d/c he can use the discount cards. Staff RN will need to provide the patient a discount card to the patient before transition home. Case Manager discussed if other medications are ordered for home-he may be a candidate for MATCH if the cost is too expensive. Weekend Case Manager to follow for MATCH needs. We discussed if MATCH utilized the Rx's will be $3.00  each. Case Manager will call the Internal Medicine Clinic to schedule a hospital follow up appointment. Information will be placed on the AVS. No further needs identified at the visit. ? ?1411 10-08-21 Unable to schedule an appointment at the internal medicine clinic-closed for the day. [facility] has no appointments available until August. Case Manager checked with [facility] and appointment scheduled. Information placed on the AVS. No further needs at this time.    ? ?1445 [facility]OC Pharmacy will deliver the Xarelto starter pack to the room prior to discharge. Pharmacy can use the 30 day free card for the starter pack. No further needs at this time.  ?

## 2021-10-08 NOTE — Progress Notes (Signed)
ANTICOAGULATION CONSULT NOTE - Follow Up Consult ? ?Pharmacy Consult for heparin ?Indication: DVT ? ?Labs: ?Recent Labs  ?  10/07/21 ?1316 10/07/21 ?1540 10/07/21 ?2122 10/08/21 ?0254  ?HGB 13.9 12.2* 12.4* 12.0*  ?HCT 41.0 37.4* 36.5* 36.0*  ?PLT  --  338 311 307  ?HEPARINUNFRC  --  0.33 <0.10* <0.10*  ?CREATININE 0.90  --   --   --   ? ? ?Assessment: ?31yo male remains subtherapeutic on heparin after rate change, heparin running through sheath with alteplase; no infusion issues or signs of bleeding per RN. ? ?Goal of Therapy:  ?Heparin level 0.2-.5 units/ml ?  ?Plan:  ?Will increase heparin infusion by 3 units/kg/hr to 1300 units/hr and check level with next scheduled labs.   ? ?Andrew Reilly, PharmD, BCPS  ?10/08/2021,4:20 AM ? ? ?

## 2021-10-08 NOTE — Progress Notes (Signed)
[facility] [place] ? ?Subjective  -states right arm swelling is improved after initiation of thrombolysis. ? ? ?Objective ?107/75 ?79 ?98.2 ?F (36.8 ?C) (Oral) ?16 ?99% ? ?Intake/Output Summary (Last 24 hours) at 10/08/2021 1045 ?Last data filed at 10/08/2021 0600 ?Gross per 24 hour  ?Intake 2168.84 ml  ?Output --  ?Net 2168.84 ml  ? ? ?Right radial pulse palpable ?Right hand warm ?Venous sheath in the right basilic vein with no evidence of hematoma ? ?Laboratory ?Lab Results: ?Recent Labs  ?  10/08/21 ?0254 10/08/21 ?0957  ?WBC 7.4 6.6  ?HGB 12.0* 12.1*  ?HCT 36.0* 36.6*  ?PLT 307 288  ? ?BMET ?Recent Labs  ?  10/07/21 ?1316 10/08/21 ?0254  ?NA 140 137  ?K 4.0 3.7  ?CL 105 109  ?CO2  --  22  ?GLUCOSE 102* 89  ?BUN 11 9  ?CREATININE 0.90 0.95  ?CALCIUM  --  9.1  ? ? ?COAG ?No results found for: INR, PROTIME ?No results found for: PTT ? ?Assessment/Planning: ? ?31 year old male underwent initiation of thrombolysis yesterday for extensive right upper extremity DVT with suspected venous TOS.  No significant evens overnight and subjectively feels his arm swelling is better today.  Hgb stable.  Plan thrombolytics catheter check today with Dr. Lenell Antu.  Please keep NPO.   ? ?Cephus Shelling ?10/08/2021 ?10:45 AM ?-- ? ? ?

## 2021-10-08 NOTE — Discharge Instructions (Signed)
?  Brachial Site Care ? ? ?This sheet gives you information about how to care for yourself after your procedure. Your health care provider may also give you more specific instructions. If you have problems or questions, contact your health care provider. ?What can I expect after the procedure? ?After the procedure, it is common to have: ?Bruising and tenderness at the catheter insertion area. ?Follow these instructions at home: ?Medicines ?Take over-the-counter and prescription medicines only as told by your health care provider. ?Insertion site care ?Follow instructions from your health care provider about how to take care of your insertion site. Make sure you: ?Wash your hands with soap and water before you change your bandage (dressing). If soap and water are not available, use hand sanitizer. ?Remove your dressing as told by your health care provider. In 24 hours ?Check your insertion site every day for signs of infection. Check for: ?Redness, swelling, or pain. ?Fluid or blood. ?Pus or a bad smell. ?Warmth. ?Do not take baths, swim, or use a hot tub until your health care provider approves. ?You may shower 24-48 hours after the procedure, or as directed by your health care provider. ?Remove the dressing and gently wash the site with plain soap and water. ?Pat the area dry with a clean towel. ?Do not rub the site. That could cause bleeding. ?Do not apply powder or lotion to the site. ?Activity ? ? ?Do not lift anything that is heavier than 10 lb (4.5 kg), or the limit that you are told, until your health care provider says that it is safe.  For 2 days ?Ask your health care provider when it is okay to: ?Return to work or school. ?Resume usual physical activities or sports. ?Resume sexual activity. ?General instructions ?If the catheter site starts to bleed, raise your arm and put firm pressure on the site. If the bleeding does not stop, get help right away. ?If you went home on the same day as your procedure, a  responsible adult should be with you for the first 24 hours after you arrive home. ?Keep all follow-up visits as told by your health care provider. This is important. ?Contact a health care provider if: ?You have a fever. ?You have redness, swelling, or yellow drainage around your insertion site. ?Get help right away if: ?The catheter insertion area swells very fast. ?The insertion area is bleeding, and the bleeding does not stop when you hold steady pressure on the area. ?These symptoms may represent a serious problem that is an emergency. Do not wait to see if the symptoms will go away. Get medical help right away. Call your local emergency services (911 in the [place] Do not drive yourself to the hospital. ?Summary ?After the procedure, it is common to have bruising and tenderness at the site. ?Follow instructions from your health care provider about how to take care of your radial site wound. Check the wound every day for signs of infection. ?Do not lift anything that is heavier than 10 lb (4.5 kg), or the limit that you are told, until your health care provider says that it is safe. ?This information is not intended to replace advice given to you by your health care provider. Make sure you discuss any questions you have with your health care provider. ?Document Revised: 07/05/2017 Document Reviewed: 07/05/2017 ?Elsevier Patient Education ? 2020 Elsevier Inc. ? ?

## 2021-10-11 ENCOUNTER — Encounter (HOSPITAL_COMMUNITY): Payer: Self-pay | Admitting: Vascular Surgery

## 2021-10-18 NOTE — Discharge Summary (Signed)
Physician Discharge Summary  ? ?Patient ID: ?Andrew Reilly ?[id number] ?31 y.o. ?07-07-1990 ? ?Admit date: 10/07/2021 ? ?Discharge date and time: 10/08/2021  4:14 PM  ? ?Admitting Physician: Cephus Shelling, MD  ? ?Discharge Physician: Leonie Douglas, MD ? ?Admission Diagnoses: Arm DVT (deep venous thromboembolism), acute (HCC) [I82.629] ? ?Discharge Diagnoses: same ? ?Admission Condition: good ? ?Discharged Condition: good ? ?Indication for Admission: Andrew Reilly is a 31 y.o. male, who presented to [facility] emergency department last night with complaints of swelling in the right arm for several days.  He is very active with weightlifting.  There is no family history of DVT.  He has never had a prior episode of DVT.  He states that whenever he starts using his arm even with minimal activity, he gets very swollen.  He does have a history of right shoulder arthroscopy about 5 years ago.  He was given a dose of Lovenox in the ER last night, however this was a 30 mg dose.  He was sent to get Xarelto however this cost $2000 and so he did not get it ?  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 He had a complete response to thrombolysis. After finding an anticoagulant that he could afford, he was discharged in good condition. ? ?Consults: None ? ?Significant Diagnostic Studies: angiography: complete response to catheter directed thrombolysis ? ?Treatments: as abive ? ?Discharge Exam: ?BP 130/74   Pulse 84   Temp 98.5 ?F (36.9 ?C) (Oral)   Resp (!) 21   Ht 5\' 8"  (1.727 m)   Wt 77.9 kg   SpO2 100%   BMI 26.11 kg/m?  ? ?General Appearance:    Alert, cooperative, no distress, appears stated age  ?Head:    Normocephalic, without obvious abnormality, atraumatic  ?Eyes:    PERRL, conjunctiva/corneas clear, EOM's intact, fundi  ?  benign, both eyes       ?Ears:    Normal TM's and external ear canals, both ears  ?Nose:   Nares  normal, septum midline, mucosa normal, no drainage    or sinus tenderness  ?Throat:   Lips, mucosa, and tongue normal; teeth and gums normal  ?Neck:   Supple, symmetrical, trachea midline, no adenopathy;     ?  thyroid:  No enlargement/tenderness/nodules; no carotid ?  bruit or JVD  ?Back:     Symmetric, no curvature, ROM normal, no CVA tenderness  ?Lungs:     Clear to auscultation bilaterally, respirations unlabored  ?Chest wall:    No tenderness or deformity  ?Heart:    Regular rate and rhythm, S1 and S2 normal, no murmur, rub   or gallop  ?Abdomen:     Soft, non-tender, bowel sounds active all four quadrants,  ?  no masses, no organomegaly  ?Genitalia:    Normal male without lesion, discharge or tenderness  ?Rectal:    Normal tone, normal prostate, no masses or tenderness; ?  guaiac negative stool  ?Extremities:   Extremities normal, atraumatic, no cyanosis or edema  ?Pulses:   2+ and symmetric all extremities  ?Skin:   Skin color, texture, turgor normal, no rashes or lesions  ?Lymph nodes:   Cervical, supraclavicular, and axillary nodes normal  ?Neurologic:   CNII-XII intact. Normal strength, sensation and reflexes    ?  throughout  ? ? ?Disposition: Discharge disposition: 01-Home or Self Care ? ? ? ? ? ? ?Patient Instructions:  ?Allergies as of  10/08/2021   ?No Known Allergies ?  ? ?  ?Medication List  ?  ? ?TAKE these medications   ? ?HYDROcodone-acetaminophen 7.5-325 MG tablet ?Commonly known as: Norco ?Take 1-2 tablets by mouth every 6 (six) hours as needed for moderate pain. ?  ?NON FORMULARY ?Take 2 tablets by mouth See admin instructions. Thermo XT Thermogenic Supplement- Take 2 capsules by mouth two times a day with meals ?  ?NON FORMULARY ?Take 1 packet by mouth See admin instructions. Animal Pak 44 and Primal Pre Workout Selina Cooley- Take 1 packet by mouth daily ?  ?ondansetron 4 MG disintegrating tablet ?Commonly known as: Zofran ODT ?Take 1 tablet (4 mg total) by mouth every 8 (eight) hours as needed for  nausea or vomiting. ?  ?Xarelto Starter Pack ?Generic drug: Rivaroxaban Stater Pack (15 mg and 20 mg) ?Follow package directions: Take one 15mg  tablet by mouth twice a day. On day 22, switch to one 20mg  tablet once a day. Take with food. ?What changed:  ?how much to take ?how to take this ?when to take this ?  ? ?  ? ?Activity: activity as tolerated ?Diet: regular diet ?Wound Care: none needed ? ?Follow-up with Dr. in 4 weeks. ? ?Signed: ? ?10/18/2021 ?9:21 AM ? ?

## 2021-10-22 ENCOUNTER — Other Ambulatory Visit (HOSPITAL_COMMUNITY): Payer: Self-pay

## 2021-10-22 ENCOUNTER — Telehealth (HOSPITAL_BASED_OUTPATIENT_CLINIC_OR_DEPARTMENT_OTHER): Payer: Self-pay

## 2021-10-22 NOTE — Telephone Encounter (Signed)
Pharmacy Transitions of Care Follow-up Telephone Call ? ?Date of discharge: 10/07/2021  ?Discharge Diagnosis: DVT ? ?How have you been since you were released from the hospital? Has been experiencing "weird side effects since leaving from the hospital and starting rivaroxaban". Pt reports dermatologic reactions, face was flushed immediately upon discharge, then had very dry, flaky skin on face--resolved with moisturizer. Pt now experiencing dry calluses on hands/fingers. Pt denied reaction on trunk of body, denied any difficulty breathing or additional signs of allergic reaction. Noted "feeling the blood moving through his legs". Pt agreed with discussing above concerns with provider at follow-up in 3 days. Pt reports doing well otherwise, pain and swelling in arm is resolving. ? ?Medication changes made at discharge: ? - START: rivaroxaban ? ?Medication changes verified by the patient? Yes ?  ? ?Medication Accessibility: ? ? ?Was the patient provided with refills on discharged medications? No, will get refills from cardiologist upon follow up next week. ? ?Duane Boston, PharmD Candidate ?[facility] ? ?

## 2021-10-25 ENCOUNTER — Ambulatory Visit (INDEPENDENT_AMBULATORY_CARE_PROVIDER_SITE_OTHER): Payer: Self-pay | Admitting: Surgery

## 2021-10-25 ENCOUNTER — Telehealth (HOSPITAL_COMMUNITY): Payer: Self-pay | Admitting: Licensed Clinical Social Worker

## 2021-10-25 ENCOUNTER — Encounter: Payer: Self-pay | Admitting: Surgery

## 2021-10-25 ENCOUNTER — Other Ambulatory Visit: Payer: Self-pay

## 2021-10-25 VITALS — BP 134/87 | HR 83 | Temp 98.3°F | Resp 20 | Ht 68.0 in | Wt 170.7 lb

## 2021-10-25 DIAGNOSIS — I82A11 Acute embolism and thrombosis of right axillary vein: Secondary | ICD-10-CM

## 2021-10-25 MED ORDER — RIVAROXABAN 20 MG PO TABS: 20.0000 mg | ORAL_TABLET | Freq: Every day | ORAL | 6 refills | Status: AC

## 2021-10-25 NOTE — Telephone Encounter (Signed)
CSW received call from staff at [facility] requesting assistance with medications. Patient recently hospitalized and discharged with Xarelto 30 day co pay card and now unable to afford the cost to remain on the medication. CSW discussed with staff who shared that they will provide patient with samples and assist with completion of the Patient Assistance application. CSW contacted patient to discuss plan and also will mail patient a [employer] application as he has no insurance. Patient has an appointment with [facility] for PCP and CSW explained additional resources available to patient through the PCP office. Patient verbalizes understanding of the importance of keeping PCP appointment tomorrow. CSW will assist and coordinate as needed with [facility] and any other needs as they arise. Lasandra Beech, LCSW, CCSW-MCS [phone] ? ?

## 2021-10-25 NOTE — Progress Notes (Signed)
? ?[facility] [place] ? ?Patient name: Andrew Reilly MRN: [id number] DOB: [birth year] Sex: male ? ? ?REASON FOR VISIT:  ? ? ?Post op ? ?HISOTRY OF PRESENT ILLNESS:  ? ? ?Andrew Reilly is a 31 y.o. male who presented to [facility] emergency department last night with complaints of swelling in the right arm for several days.  He is very active with weightlifting.  There is no family history of DVT.  He has never had a prior episode of DVT.  He states that whenever he starts using his arm even with minimal activity, he gets very swollen.  He does have a history of right shoulder arthroscopy about 5 years ago.  He was given a dose of Lovenox in the ER last night, however this was a 30 mg dose.  He was sent to get Xarelto however this cost $2000 and so he did not get it ? ?He then on 10/07/2021 he had a lysis catheter placed.  This was successful without requiring any additional maneuvers.  He is back today stating that his swelling has resolved.  He continues to take his Xarelto ? ?PAST MEDICAL HISTORY:  ? ?Past Medical History:  ?Diagnosis Date  ? DVT (deep venous thrombosis) ([place])   ? GERD (gastroesophageal reflux disease)   ? Impingement syndrome of right shoulder   ? Partial tear of right rotator cuff   ? ? ? ?FAMILY HISTORY:  ? ?No family history on file. ? ?SOCIAL HISTORY:  ? ?Social History  ? ?Tobacco Use  ? Smoking status: Every Day  ?  Packs/day: 0.25  ?  Years: 5.00  ?  Pack years: 1.25  ?  Types: Cigarettes  ?  Passive exposure: Never  ? Smokeless tobacco: Never  ?Substance Use Topics  ? Alcohol use: No  ? ? ? ?ALLERGIES:  ? ?No Known Allergies ? ? ?CURRENT MEDICATIONS:  ? ?Current Outpatient Medications  ?Medication Sig Dispense Refill  ? HYDROcodone-acetaminophen (NORCO) 7.5-325 MG tablet Take 1-2 tablets by mouth every 6 (six) hours as needed for moderate pain. 45 tablet 0  ? NON FORMULARY Take 2 tablets by mouth See admin instructions. Thermo XT  Thermogenic Supplement- Take 2 capsules by mouth two times a day with meals    ? NON FORMULARY Take 1 packet by mouth See admin instructions. Animal Pak 44 and Primal Pre Workout Quinn Axe- Take 1 packet by mouth daily    ? RIVAROXABAN (XARELTO) VTE STARTER PACK (15 & 20 MG) Follow package directions: Take one 15mg  tablet by mouth twice a day. On day 22, switch to one 20mg  tablet once a day. Take with food. 51 each 0  ? ondansetron (ZOFRAN ODT) 4 MG disintegrating tablet Take 1 tablet (4 mg total) by mouth every 8 (eight) hours as needed for nausea or vomiting. (Patient not taking: Reported on 10/25/2021) 20 tablet 0  ? ?No current facility-administered medications for this visit.  ? ? ?REVIEW OF SYSTEMS:  ? ?[X]  denotes positive finding, [ ]  denotes negative finding ?Cardiac  Comments:  ?Chest pain or chest pressure:    ?Shortness of breath upon exertion:    ?Short of breath when lying flat:    ?Irregular heart rhythm:    ?    ?Vascular    ?Pain in calf, thigh, or hip brought on by ambulation:    ?Pain in feet at night that wakes you up from your sleep:     ?Blood clot in your veins:    ?  Leg swelling:     ?    ?Pulmonary    ?Oxygen at home:    ?Productive cough:     ?Wheezing:     ?    ?Neurologic    ?Sudden weakness in arms or legs:     ?Sudden numbness in arms or legs:     ?Sudden onset of difficulty speaking or slurred speech:    ?Temporary loss of vision in one eye:     ?Problems with dizziness:     ?    ?Gastrointestinal    ?Blood in stool:     ?Vomited blood:     ?    ?Genitourinary    ?Burning when urinating:     ?Blood in urine:    ?    ?Psychiatric    ?Major depression:     ?    ?Hematologic    ?Bleeding problems:    ?Problems with blood clotting too easily:    ?    ?Skin    ?Rashes or ulcers:    ?    ?Constitutional    ?Fever or chills:    ? ? ?PHYSICAL EXAM:  ? ?Vitals:  ? 10/25/21 1110  ?BP: 134/87  ?Pulse: 83  ?Resp: 20  ?Temp: 98.3 ?F (36.8 ?C)  ?TempSrc: Temporal  ?SpO2: 96%  ?Weight: 170 lb 11.2 oz (77.4  kg)  ?Height: 5\' 8"  (1.727 m)  ? ? ?GENERAL: The patient is a well-nourished male, in no acute distress. The vital signs are documented above. ?CARDIAC: There is a regular rate and rhythm.  ?VASCULAR: No significant arm swelling.  Palpable radial pulses ?PULMONARY: Non-labored respirations ?ABDOMEN: Soft and non-tender with normal pitched bowel sounds.  ?MUSCULOSKELETAL: There are no major deformities or cyanosis. ?NEUROLOGIC: No focal weakness or paresthesias are detected. ?SKIN: There are no ulcers or rashes noted. ?PSYCHIATRIC: The patient has a normal affect. ? ?STUDIES:  ? ? ? ?MEDICAL ISSUES:  ? ?Right-sided [name]'s: I discussed with the patient that the neck step would be first rib resection on the right to prevent recurrent disease.  We discussed the details of the procedure and the recovery.  This will be scheduled in the immediate future.  I am giving him a refill for his Xarelto which I stressed the importance of not letting this run out. ? ? ? ?Annamarie Major, IV, MD, FACS ?[facility] [place] ?Tel [phone] ?Pager [phone]  ?

## 2021-10-26 ENCOUNTER — Ambulatory Visit (INDEPENDENT_AMBULATORY_CARE_PROVIDER_SITE_OTHER): Payer: Self-pay

## 2021-10-26 ENCOUNTER — Encounter (INDEPENDENT_AMBULATORY_CARE_PROVIDER_SITE_OTHER): Payer: Self-pay | Admitting: Primary Care

## 2021-10-26 ENCOUNTER — Ambulatory Visit (INDEPENDENT_AMBULATORY_CARE_PROVIDER_SITE_OTHER): Payer: Self-pay | Admitting: Primary Care

## 2021-10-26 ENCOUNTER — Telehealth: Payer: Self-pay

## 2021-10-26 VITALS — BP 124/74 | HR 86 | Temp 98.0°F | Ht 68.0 in | Wt 171.2 lb

## 2021-10-26 DIAGNOSIS — Z09 Encounter for follow-up examination after completed treatment for conditions other than malignant neoplasm: Secondary | ICD-10-CM

## 2021-10-26 DIAGNOSIS — R234 Changes in skin texture: Secondary | ICD-10-CM

## 2021-10-26 DIAGNOSIS — Z7689 Persons encountering health services in other specified circumstances: Secondary | ICD-10-CM

## 2021-10-26 DIAGNOSIS — I82621 Acute embolism and thrombosis of deep veins of right upper extremity: Secondary | ICD-10-CM

## 2021-10-26 NOTE — Progress Notes (Signed)
?[facility] ? ? ?Subjective:  ? Andrew Reilly is a 31 y.o. male presents for hospital follow up and establish care. Admit date to the hospital was 10/07/21, presents for right arm pain / swelling he denied any trauma or different actives discharged from the hospital on 10/08/21, dx Acute deep vein thrombosis (DVT) of right upper extremity, unspecified vein. Denies shortness of breath , chest pain, heart racing,and lightheadedness.  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  Patient contacted the prescriber vascular notes indicate to follow-up with PCP.  Establishment of care is today.  Please refer to pictures below. ? ?Past Medical History:  ?Diagnosis Date  ? DVT (deep venous thrombosis) (HCC)   ? GERD (gastroesophageal reflux disease)   ? Impingement syndrome of right shoulder   ? Partial tear of right rotator cuff   ?  ? ?No Known Allergies ? ?  ?Current Outpatient Medications on File Prior to Visit  ?Medication Sig Dispense Refill  ? rivaroxaban (XARELTO) 20 MG TABS tablet Take 1 tablet (20 mg total) by mouth daily with supper. 30 tablet 6  ? NON FORMULARY Take 2 tablets by mouth See admin instructions. Thermo XT Thermogenic Supplement- Take 2 capsules by mouth two times a day with meals (Patient not taking: Reported on 10/26/2021)    ? NON FORMULARY Take 1 packet by mouth See admin instructions. Animal Pak 44 and Primal Pre Workout Selina Cooley- Take 1 packet by mouth daily (Patient not taking: Reported on 10/26/2021)    ? ?No current facility-administered medications on file prior to visit.  ? ? ? ?Review of System: ?Comprehensive ROS Pertinent positive and negative noted in HPI   ? ?Objective:  ?BP 124/74 (BP Location: Left Arm, Patient Position: Sitting, Cuff Size: Normal)   Pulse 86   Temp 98 ?F (36.7 ?C) (Oral)   Ht 5\' 8"  (1.727 m)   Wt 171 lb 3.2 oz (77.7 kg)   SpO2 94%   BMI  26.03 kg/m?  ? Weights  ? 10/26/21 10/28/21  ?Weight: 171 lb 3.2 oz (77.7 kg)  ? ? ?Physical Exam: ?General Appearance: Well nourished, in no apparent distress. ?Eyes: PERRLA, EOMs, conjunctiva no swelling or erythema ?Sinuses: No Frontal/maxillary tenderness ?ENT/Mouth: Ext aud canals clear, TMs without erythema, bulging. No erythema, swelling, or exudate on post pharynx.  Tonsils not swollen or erythematous. Hearing normal.  ?Neck: Supple, thyroid normal.  ?Respiratory: Respiratory effort normal, BS equal bilaterally without rales, rhonchi, wheezing or stridor.  ?Cardio: RRR with no MRGs. Brisk peripheral pulses without edema.  ?Abdomen: Soft, + BS.  Non tender, no guarding, rebound, hernias, masses. ?Lymphatics: Non tender without lymphadenopathy.  ?Musculoskeletal: Full ROM, 5/5 strength, normal gait.  ?Skin: see pictures   ?Neuro: Cranial nerves intact. Normal muscle tone, no cerebellar symptoms. Sensation intact.  ?Psych: Awake and oriented X 3, normal affect, Insight and Judgment appropriate.  ? ? ?Assessment:  ?Zakaree was seen today for hospitalization follow-up. ? ?Diagnoses and all orders for this visit: ? ?Hospital discharge follow-up ?Excuses (Printed 10/06/2021) ?ED After Visit Summary (Printed 10/06/2021) ?Follow-Ups: Follow up with 10/08/2021, MD (Vascular Surgery); No eat or drink after midnight -- go to this office at 10:30a. ? ?Acute embolism and thrombosis of deep vein of right upper extremity (HCC) ?Rivaroxaban 15 mg Oral 2 times daily, Follow package directions: Take one 15mg  tablet by mouth twice  a day. On day 22, switch to one 20mg  tablet once a day. Take with food ? ?Peeling skin r/o SJS ? ? ? 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  Advised patient to go to ED and call Vascular surgeon to make him aware of this situation. ? ?Encounter to establish care ?Establish care with PCP ?  ? ?This note has been created with . Any transcriptional errors are unintentional.  ? ?Print production planner, NP ?10/26/2021, 10:05 AM ?  ? ?

## 2021-10-26 NOTE — Telephone Encounter (Signed)
Please contact patient. He was advised to go to ED but does not want to go sit and wait.  ?

## 2021-10-26 NOTE — Telephone Encounter (Signed)
? ? ? ?  Chief Complaint: Pt. States Xarelto is causing skin peeling. Saw Ms. Edwards today. "She said I have some kind of syndrome." States "Vein and Vascular told me to go to ED about this blood thinner and I don't want to go and sit for hours." ?Symptoms: Peeling skin ?Frequency:  ?Pertinent Negatives: Patient denies n/a ?Disposition: [] ED /[] Urgent Care (no appt availability in office) / [] Appointment(In office/virtual)/ []  [facility]/ [] Home Care/ [] Refused Recommended Disposition /[] [place] [facility]/ [x]  Follow-up with PCP ?Additional Notes: Please advise pt.  ?Answer Assessment - Initial Assessment Questions ?1. NAME of MEDICATION: "What medicine are you calling about?" ?    Xarelto ?2. QUESTION: "What is your question?" (e.g., double dose of medicine, side effect) ?    Skin is peeling to hands and face ?3. PRESCRIBING HCP: "Who prescribed it?" Reason: if prescribed by specialist, call should be referred to that group. ?    Vein and vascular doctor ?4. SYMPTOMS: "Do you have any symptoms?" ?    Peeling skin ?5. SEVERITY: If symptoms are present, ask "Are they mild, moderate or severe?" ?    Moderate ?6. PREGNANCY:  "Is there any chance that you are pregnant?" "When was your last menstrual period?" ?    N/a ? ?Protocols used: Medication Question Call-A-AH ? ?

## 2021-10-26 NOTE — Telephone Encounter (Signed)
Advise patient to go to ED.

## 2021-10-26 NOTE — Telephone Encounter (Signed)
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 Patient reports he noticed flaky and peeling of skin after starting medication. Advised patient to proceed to the ED as recommended by provider during visit for further evaluation and treatment and will hold off on providing any Xarelto samples at this time. Patient was hesitant, but verbalized understanding and agreed to go the ED today.  Dr. Myra Gianotti informed.  ?

## 2021-10-27 ENCOUNTER — Other Ambulatory Visit (HOSPITAL_BASED_OUTPATIENT_CLINIC_OR_DEPARTMENT_OTHER): Payer: Self-pay

## 2021-10-28 ENCOUNTER — Other Ambulatory Visit (HOSPITAL_BASED_OUTPATIENT_CLINIC_OR_DEPARTMENT_OTHER): Payer: Self-pay

## 2021-11-15 ENCOUNTER — Encounter (HOSPITAL_COMMUNITY): Payer: Self-pay | Admitting: Anesthesiology

## 2021-11-15 NOTE — Progress Notes (Signed)
Andrew Reilly said that he is not having surgery in am. Patient said that it has not been explained to him why he needs the surgery. I sent [name], RN an instant message.

## 2021-11-16 ENCOUNTER — Inpatient Hospital Stay (HOSPITAL_COMMUNITY): Admission: RE | Admit: 2021-11-16 | Payer: Self-pay | Source: Home / Self Care | Admitting: Surgery

## 2021-11-16 SURGERY — EXCISION, RIB
Anesthesia: General | Laterality: Right

## 2023-08-24 IMAGING — US US EXTREM  UP VENOUS*R*
1 series · 13 of 24 positions shown · non-contrast
Comparison: None.

CLINICAL DATA: Right arm swelling



[Series 1: us venous img upper uni right (dvt) · portal-venous · 13 of 45 slices shown]
[im 1/45]
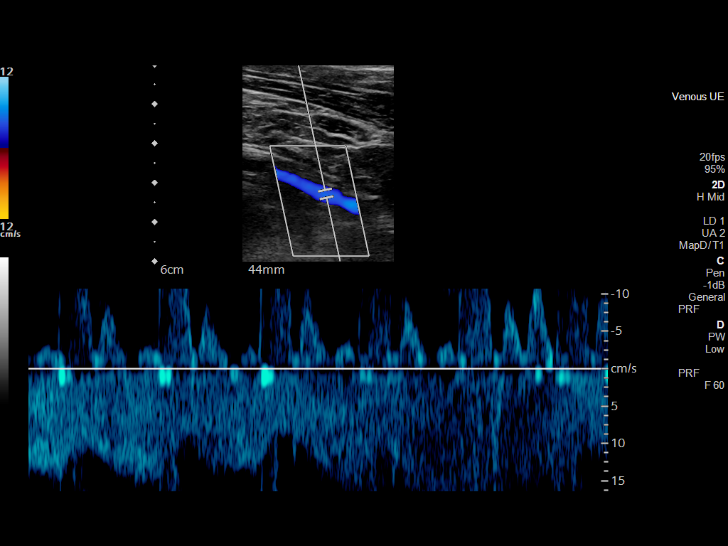
[im 4/45]
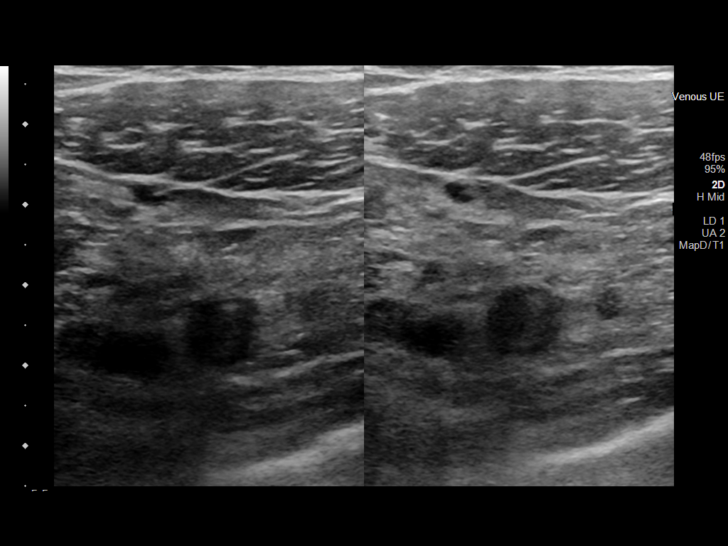
[im 8/45]
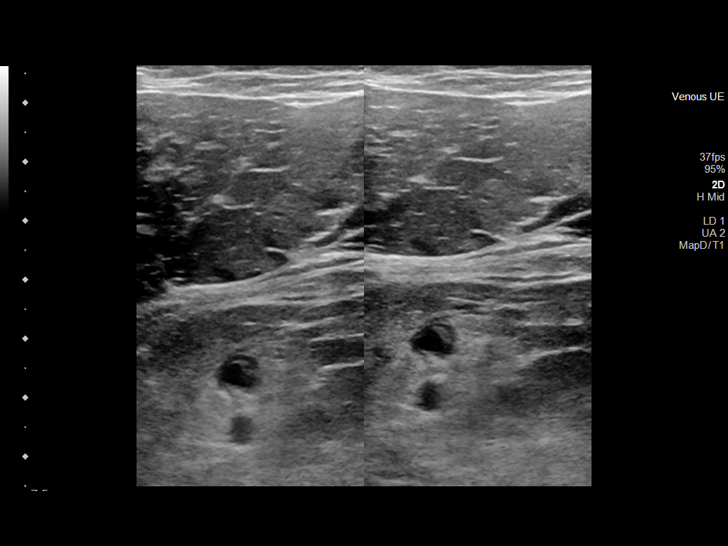
[im 12/45]
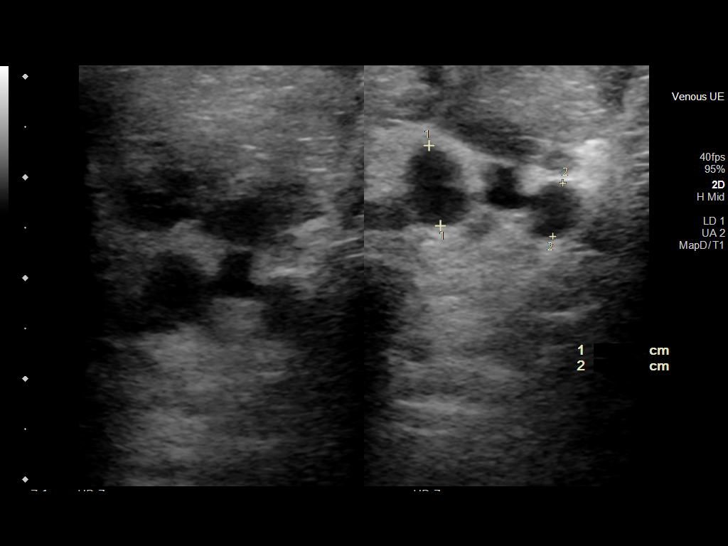
[im 16/45]
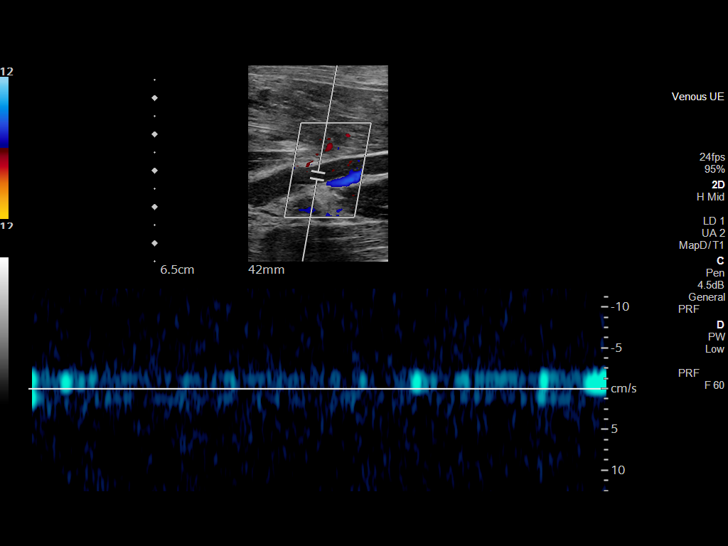
[im 20/45]
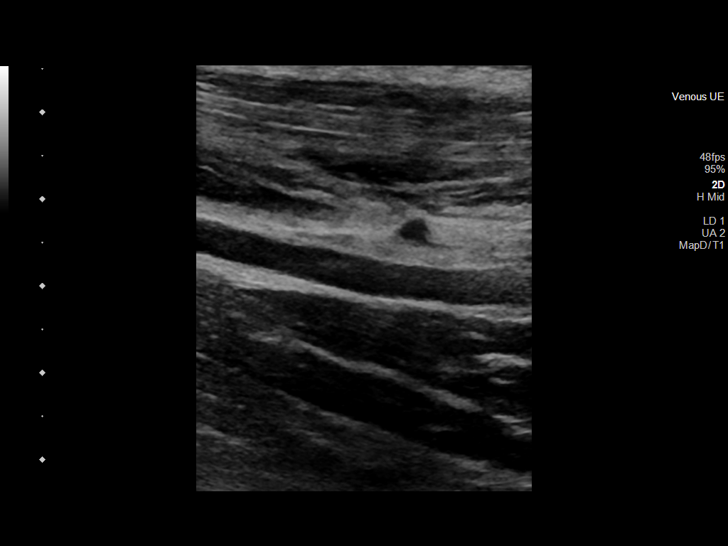
[im 23/45]
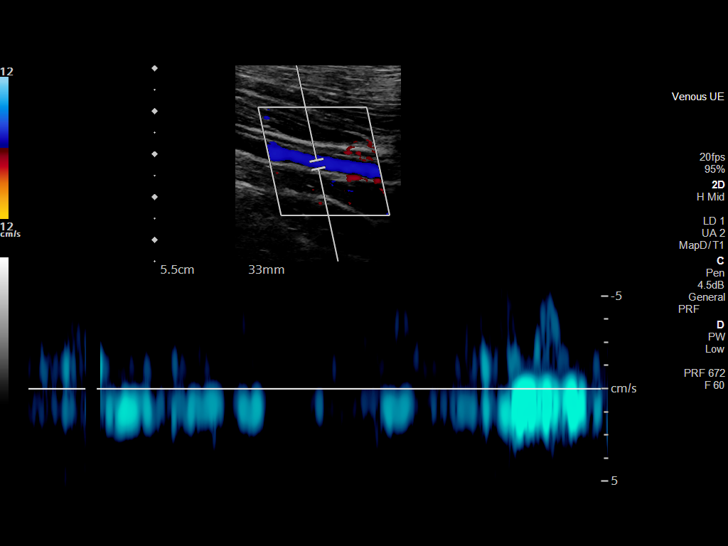
[im 25/45]
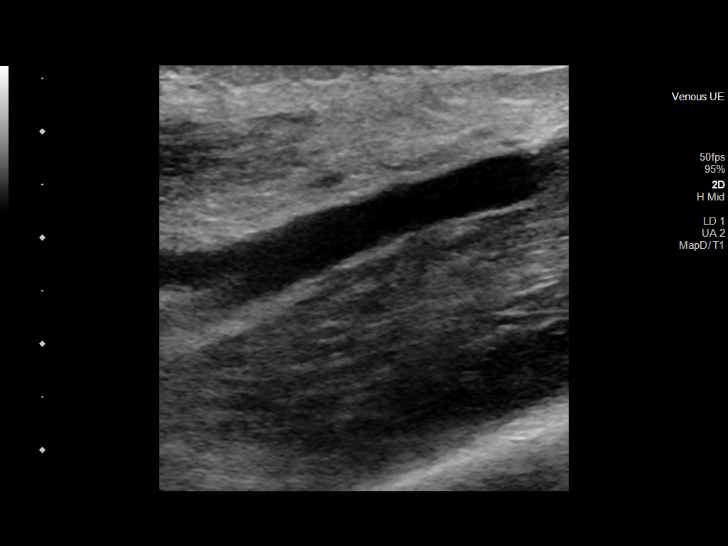
[im 29/45]
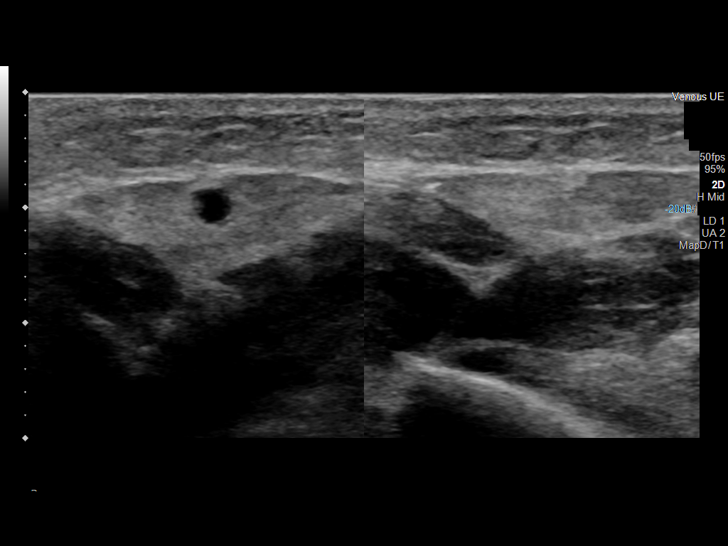
[im 33/45]
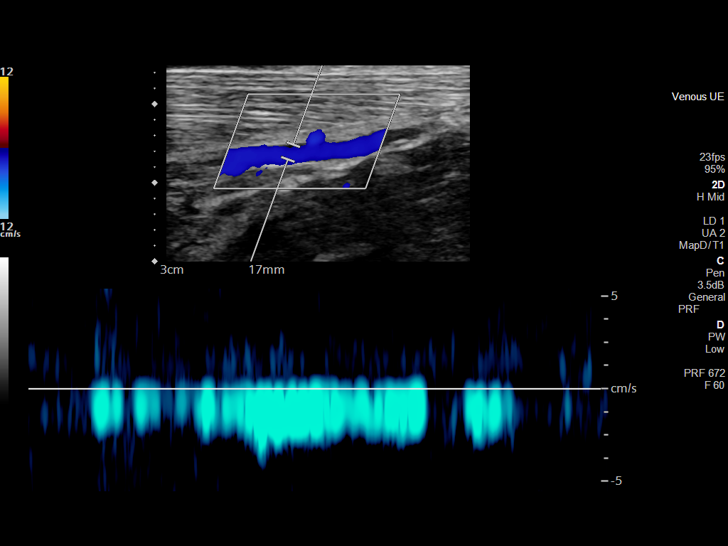
[im 37/45]
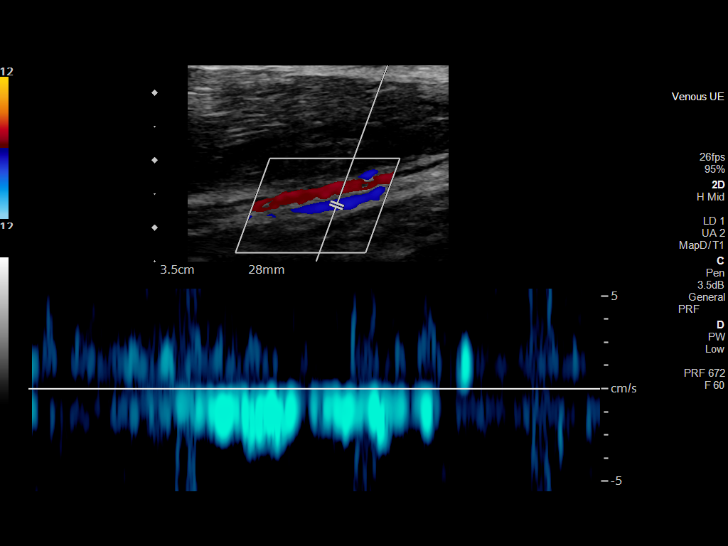
[im 41/45]
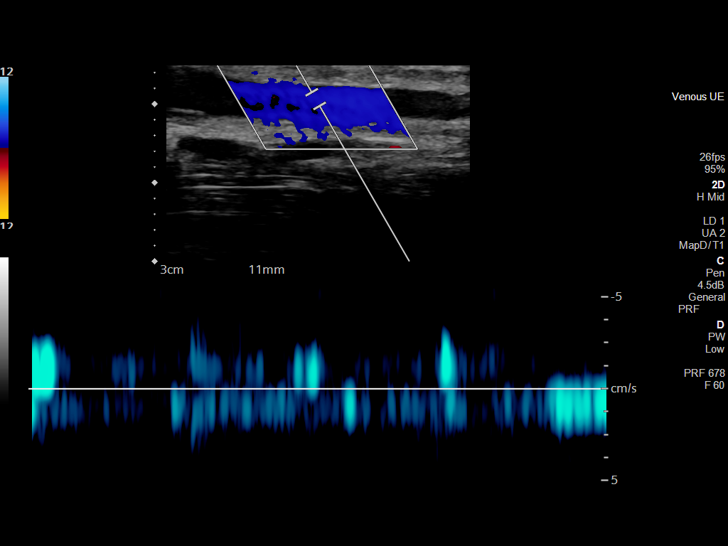
[im 45/45]
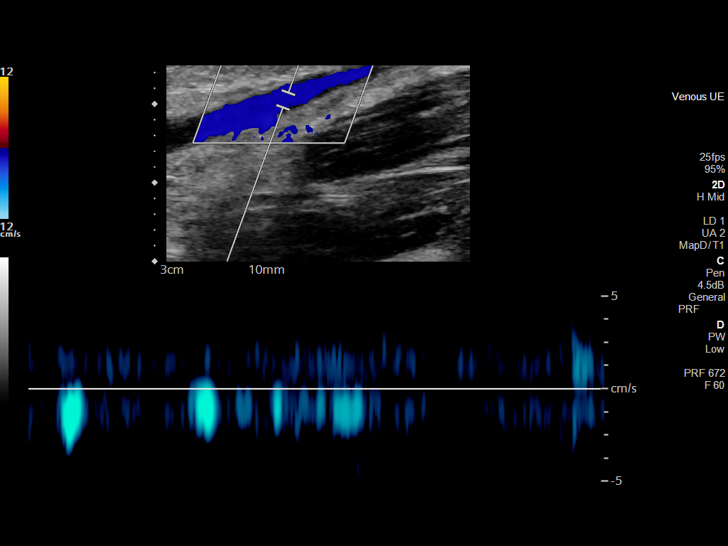

[13 of 24 positions shown; findings below may reference images not displayed]

FINDINGS: Contralateral Subclavian Vein: Respiratory phasicity is normal and
symmetric with the symptomatic side. No evidence of thrombus. Normal
compressibility.

Internal Jugular Vein: No evidence of thrombus. Normal
compressibility, respiratory phasicity and response to augmentation.

Right upper extremity veins: There is occlusive mildly expansile
thrombus within the right subclavian vein, axillary vein, basilic
vein centrally, and 1 of the 2 paired brachial veins centrally. The
radial and ulnar veins of the forearm as well as the cephalic vein
of the upper extremity are patent.
IMPRESSION: Extensive occlusive DVT within the right upper extremity as
described above.

## 2023-08-24 IMAGING — DX DG CHEST 2V
2 series · 2 of 2 positions shown · non-contrast
Comparison: None.

CLINICAL DATA: Shortness of breath

EXAM:
CHEST - 2 VIEW

[chest pa]
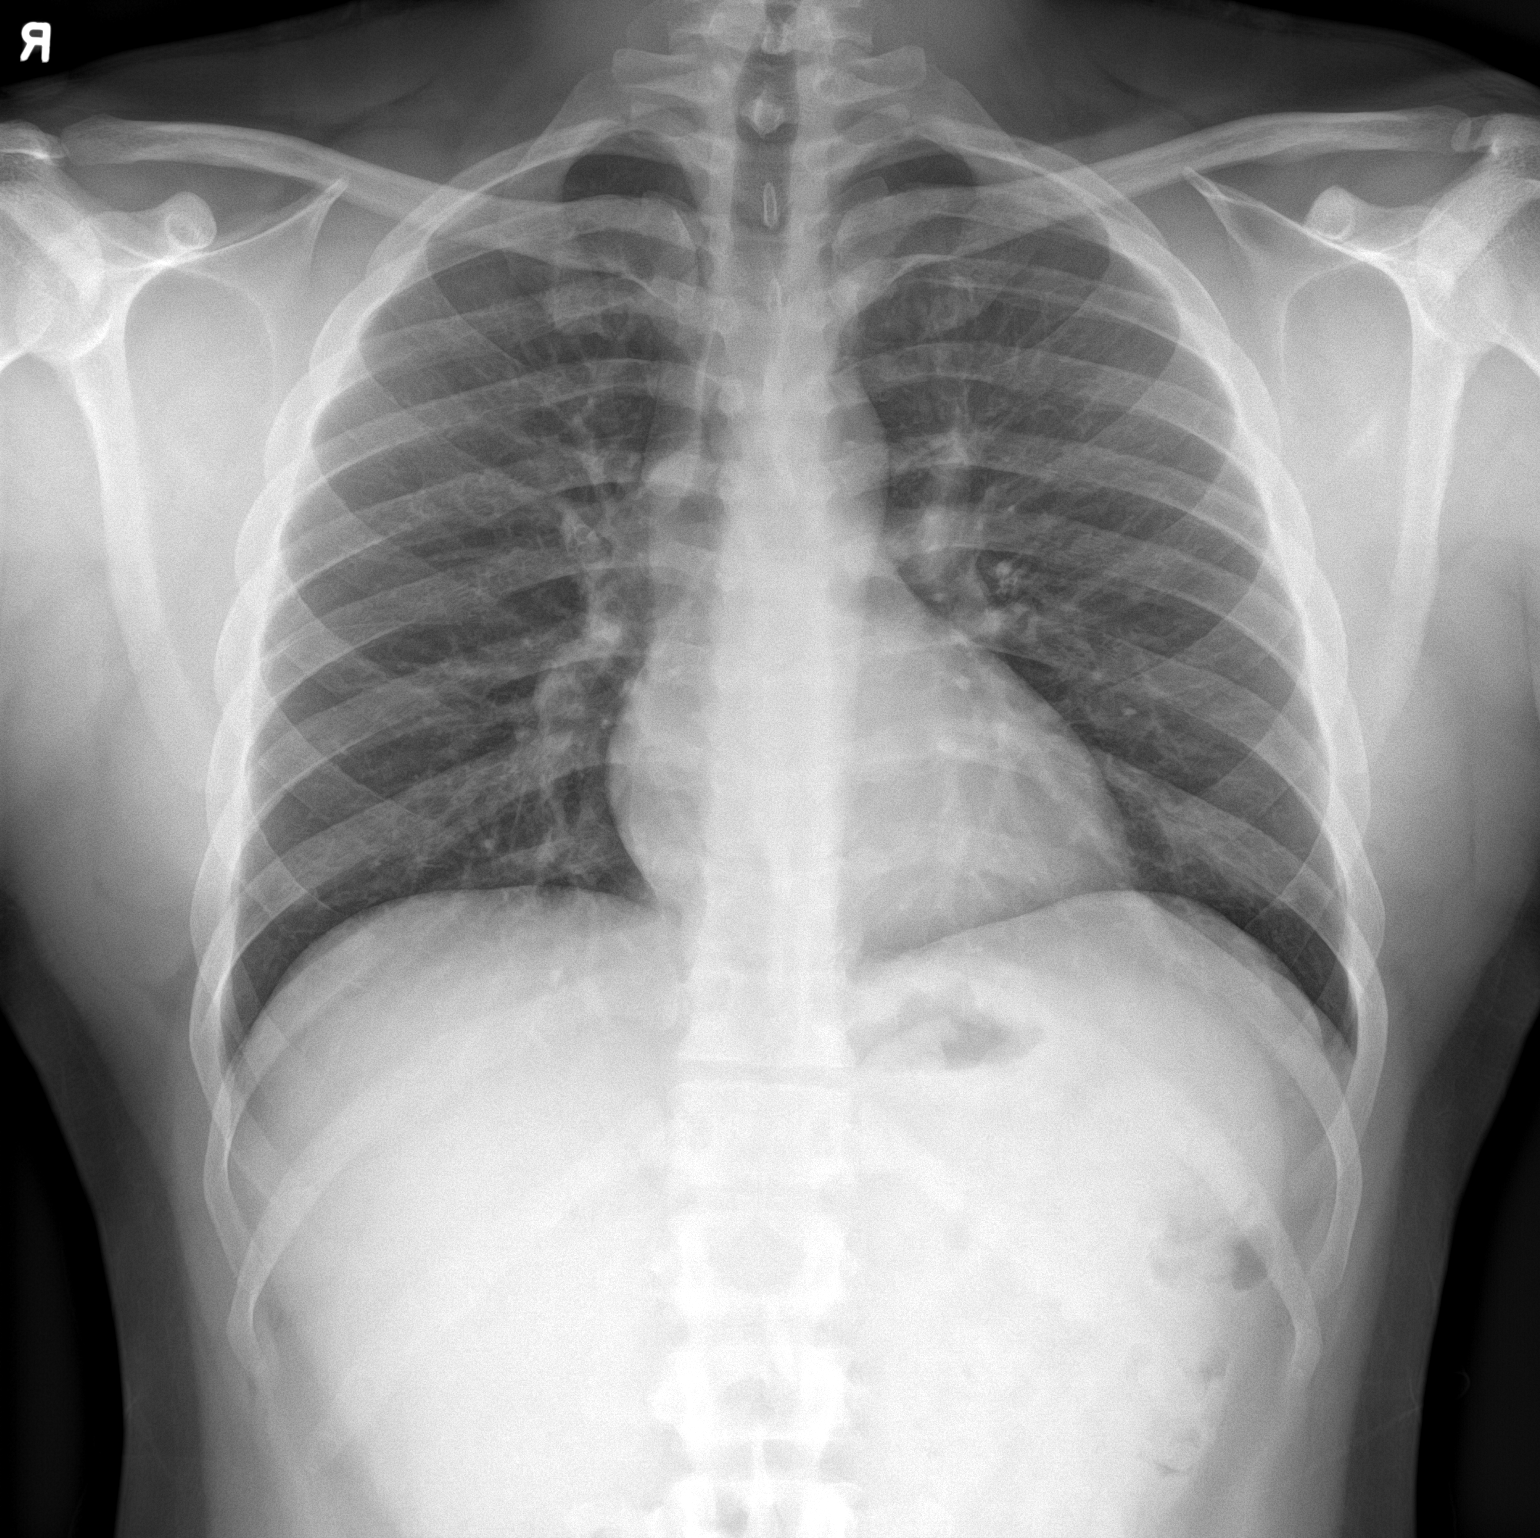

[chest lat]
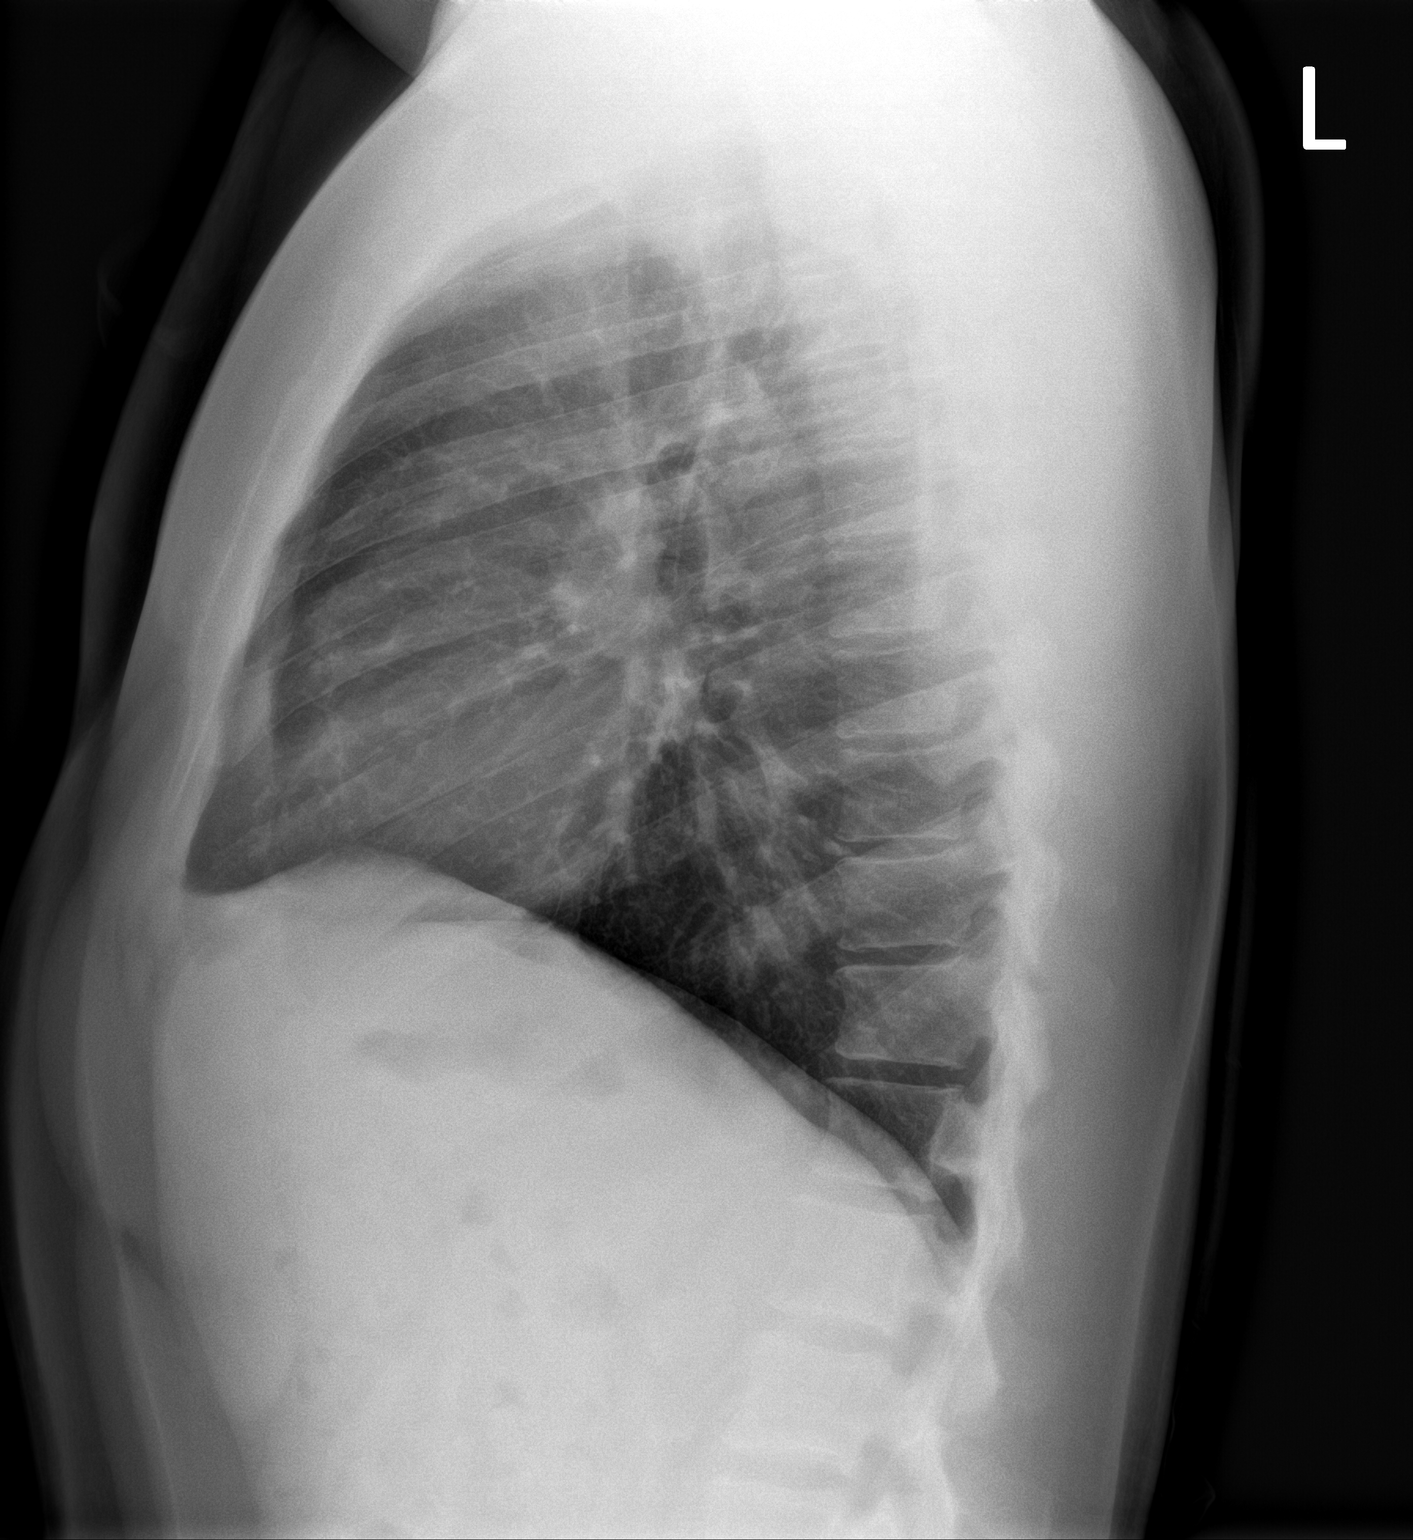

[2 of 2 positions shown; findings below may reference images not displayed]

FINDINGS: The heart size and mediastinal contours are within normal limits.
Both lungs are clear. The visualized skeletal structures are
unremarkable.
IMPRESSION: Normal study.
